# Patient Record
Sex: Female | Born: 1935 | Race: Black or African American | Hispanic: No | Marital: Single
Health system: Southern US, Community
[De-identification: ages and names within clinical notes are randomized; demographics above are authoritative.]

---

## 2004-09-26 ENCOUNTER — Ambulatory Visit: Payer: Self-pay | Admitting: Oncology

## 2004-10-24 ENCOUNTER — Ambulatory Visit: Payer: Self-pay | Admitting: Oncology

## 2004-11-02 ENCOUNTER — Ambulatory Visit: Payer: Self-pay

## 2004-11-24 ENCOUNTER — Ambulatory Visit: Payer: Self-pay | Admitting: Oncology

## 2004-12-24 ENCOUNTER — Ambulatory Visit: Payer: Self-pay | Admitting: Oncology

## 2005-02-26 ENCOUNTER — Ambulatory Visit: Payer: Self-pay | Admitting: General Surgery

## 2005-05-23 ENCOUNTER — Ambulatory Visit: Payer: Self-pay | Admitting: Oncology

## 2005-05-29 ENCOUNTER — Ambulatory Visit: Payer: Self-pay | Admitting: Oncology

## 2005-06-04 ENCOUNTER — Ambulatory Visit: Payer: Self-pay | Admitting: Oncology

## 2005-12-31 ENCOUNTER — Ambulatory Visit: Payer: Self-pay | Admitting: Oncology

## 2006-01-24 ENCOUNTER — Ambulatory Visit: Payer: Self-pay | Admitting: Oncology

## 2006-02-27 ENCOUNTER — Ambulatory Visit: Payer: Self-pay | Admitting: General Surgery

## 2006-07-22 ENCOUNTER — Ambulatory Visit: Payer: Self-pay | Admitting: Oncology

## 2006-07-24 ENCOUNTER — Ambulatory Visit: Payer: Self-pay | Admitting: Oncology

## 2007-01-20 ENCOUNTER — Ambulatory Visit: Payer: Self-pay | Admitting: Oncology

## 2007-01-24 ENCOUNTER — Ambulatory Visit: Payer: Self-pay | Admitting: Oncology

## 2007-03-21 ENCOUNTER — Ambulatory Visit: Payer: Self-pay | Admitting: General Surgery

## 2007-07-25 ENCOUNTER — Ambulatory Visit: Payer: Self-pay | Admitting: Oncology

## 2007-08-08 ENCOUNTER — Ambulatory Visit: Payer: Self-pay | Admitting: Oncology

## 2007-08-25 ENCOUNTER — Ambulatory Visit: Payer: Self-pay | Admitting: Oncology

## 2007-10-14 ENCOUNTER — Emergency Department: Payer: Self-pay | Admitting: Emergency Medicine

## 2007-11-11 ENCOUNTER — Inpatient Hospital Stay: Payer: Self-pay | Admitting: Unknown Physician Specialty

## 2007-11-11 ENCOUNTER — Other Ambulatory Visit: Payer: Self-pay

## 2007-11-19 ENCOUNTER — Ambulatory Visit: Payer: Self-pay | Admitting: *Deleted

## 2007-11-25 ENCOUNTER — Ambulatory Visit: Payer: Self-pay | Admitting: Internal Medicine

## 2007-11-28 ENCOUNTER — Ambulatory Visit: Payer: Self-pay | Admitting: *Deleted

## 2007-12-12 ENCOUNTER — Ambulatory Visit: Payer: Self-pay | Admitting: *Deleted

## 2008-01-17 ENCOUNTER — Ambulatory Visit: Payer: Self-pay | Admitting: Unknown Physician Specialty

## 2008-01-19 ENCOUNTER — Encounter: Payer: Self-pay | Admitting: Family Medicine

## 2008-01-25 ENCOUNTER — Ambulatory Visit: Payer: Self-pay | Admitting: Oncology

## 2008-02-04 ENCOUNTER — Ambulatory Visit: Payer: Self-pay | Admitting: Oncology

## 2008-02-17 ENCOUNTER — Encounter: Payer: Self-pay | Admitting: Family Medicine

## 2008-02-22 ENCOUNTER — Encounter: Payer: Self-pay | Admitting: Family Medicine

## 2008-02-22 ENCOUNTER — Ambulatory Visit: Payer: Self-pay | Admitting: Oncology

## 2008-03-24 ENCOUNTER — Ambulatory Visit: Payer: Self-pay | Admitting: Oncology

## 2008-03-24 ENCOUNTER — Encounter: Payer: Self-pay | Admitting: Family Medicine

## 2008-03-29 ENCOUNTER — Ambulatory Visit: Payer: Self-pay | Admitting: General Surgery

## 2008-04-16 ENCOUNTER — Ambulatory Visit: Payer: Self-pay | Admitting: Oncology

## 2008-04-23 ENCOUNTER — Encounter: Payer: Self-pay | Admitting: Family Medicine

## 2008-04-23 ENCOUNTER — Ambulatory Visit: Payer: Self-pay | Admitting: Oncology

## 2008-05-24 ENCOUNTER — Encounter: Payer: Self-pay | Admitting: Family Medicine

## 2008-06-23 ENCOUNTER — Encounter: Payer: Self-pay | Admitting: Family Medicine

## 2008-07-16 ENCOUNTER — Ambulatory Visit: Payer: Self-pay | Admitting: Oncology

## 2008-07-24 ENCOUNTER — Ambulatory Visit: Payer: Self-pay | Admitting: Oncology

## 2008-07-24 ENCOUNTER — Encounter: Payer: Self-pay | Admitting: Family Medicine

## 2008-08-24 ENCOUNTER — Encounter: Payer: Self-pay | Admitting: Family Medicine

## 2008-12-24 ENCOUNTER — Ambulatory Visit: Payer: Self-pay | Admitting: Oncology

## 2009-01-06 ENCOUNTER — Ambulatory Visit: Payer: Self-pay | Admitting: Oncology

## 2009-01-24 ENCOUNTER — Ambulatory Visit: Payer: Self-pay | Admitting: Oncology

## 2009-03-30 ENCOUNTER — Ambulatory Visit: Payer: Self-pay | Admitting: General Surgery

## 2009-06-23 ENCOUNTER — Ambulatory Visit: Payer: Self-pay | Admitting: Oncology

## 2009-07-07 ENCOUNTER — Ambulatory Visit: Payer: Self-pay | Admitting: Oncology

## 2009-07-24 ENCOUNTER — Ambulatory Visit: Payer: Self-pay | Admitting: Oncology

## 2009-10-07 ENCOUNTER — Ambulatory Visit: Payer: Self-pay | Admitting: Unknown Physician Specialty

## 2009-11-15 ENCOUNTER — Ambulatory Visit: Payer: Self-pay | Admitting: Anesthesiology

## 2009-11-21 ENCOUNTER — Ambulatory Visit: Payer: Self-pay | Admitting: Pain Medicine

## 2009-12-27 ENCOUNTER — Ambulatory Visit: Payer: Self-pay | Admitting: Pain Medicine

## 2010-01-16 ENCOUNTER — Ambulatory Visit: Payer: Self-pay | Admitting: Pain Medicine

## 2010-01-20 ENCOUNTER — Ambulatory Visit: Payer: Self-pay | Admitting: Gastroenterology

## 2010-01-24 ENCOUNTER — Ambulatory Visit: Payer: Self-pay | Admitting: Oncology

## 2010-02-01 ENCOUNTER — Ambulatory Visit: Payer: Self-pay | Admitting: Oncology

## 2010-02-21 ENCOUNTER — Ambulatory Visit: Payer: Self-pay | Admitting: Oncology

## 2010-02-21 ENCOUNTER — Ambulatory Visit: Payer: Self-pay | Admitting: Pain Medicine

## 2010-02-22 ENCOUNTER — Ambulatory Visit: Payer: Self-pay | Admitting: Oncology

## 2010-03-01 ENCOUNTER — Ambulatory Visit: Payer: Self-pay | Admitting: Pain Medicine

## 2010-03-23 ENCOUNTER — Ambulatory Visit: Payer: Self-pay | Admitting: Pain Medicine

## 2010-04-03 ENCOUNTER — Ambulatory Visit: Payer: Self-pay | Admitting: Pain Medicine

## 2010-04-05 ENCOUNTER — Ambulatory Visit: Payer: Self-pay | Admitting: General Surgery

## 2010-05-02 ENCOUNTER — Ambulatory Visit: Payer: Self-pay | Admitting: Pain Medicine

## 2010-06-01 ENCOUNTER — Ambulatory Visit: Payer: Self-pay | Admitting: Pain Medicine

## 2010-06-05 ENCOUNTER — Ambulatory Visit: Payer: Self-pay | Admitting: Pain Medicine

## 2010-07-13 ENCOUNTER — Ambulatory Visit: Payer: Self-pay | Admitting: Pain Medicine

## 2010-08-02 ENCOUNTER — Encounter: Payer: Self-pay | Admitting: Pain Medicine

## 2010-08-24 ENCOUNTER — Ambulatory Visit: Payer: Self-pay | Admitting: Pain Medicine

## 2010-08-30 ENCOUNTER — Encounter: Payer: Self-pay | Admitting: Pain Medicine

## 2010-10-10 ENCOUNTER — Ambulatory Visit: Payer: Self-pay | Admitting: Pain Medicine

## 2010-11-07 ENCOUNTER — Ambulatory Visit: Payer: Self-pay | Admitting: Pain Medicine

## 2011-01-26 ENCOUNTER — Ambulatory Visit: Payer: Self-pay | Admitting: Oncology

## 2011-02-13 ENCOUNTER — Ambulatory Visit: Payer: Self-pay | Admitting: Nephrology

## 2011-02-15 ENCOUNTER — Ambulatory Visit: Payer: Self-pay | Admitting: Oncology

## 2011-02-16 LAB — CANCER ANTIGEN 27.29: CA 27.29: 38.6 U/mL (ref 0.0–38.6)

## 2011-02-22 ENCOUNTER — Ambulatory Visit: Payer: Self-pay | Admitting: Oncology

## 2011-03-25 ENCOUNTER — Ambulatory Visit: Payer: Self-pay | Admitting: Oncology

## 2011-04-02 ENCOUNTER — Observation Stay: Payer: Self-pay | Admitting: Internal Medicine

## 2011-05-01 ENCOUNTER — Ambulatory Visit: Payer: Self-pay | Admitting: Oncology

## 2011-05-25 ENCOUNTER — Ambulatory Visit: Payer: Self-pay | Admitting: Oncology

## 2011-06-24 ENCOUNTER — Ambulatory Visit: Payer: Self-pay | Admitting: Oncology

## 2011-07-20 ENCOUNTER — Ambulatory Visit: Payer: Self-pay | Admitting: Specialist

## 2011-07-31 ENCOUNTER — Ambulatory Visit: Payer: Self-pay | Admitting: Oncology

## 2011-08-25 ENCOUNTER — Ambulatory Visit: Payer: Self-pay | Admitting: Oncology

## 2011-08-28 ENCOUNTER — Ambulatory Visit: Payer: Self-pay | Admitting: Oncology

## 2011-08-29 ENCOUNTER — Ambulatory Visit: Payer: Self-pay | Admitting: Pain Medicine

## 2011-08-30 ENCOUNTER — Ambulatory Visit: Payer: Self-pay | Admitting: Pain Medicine

## 2011-09-17 ENCOUNTER — Ambulatory Visit: Payer: Self-pay | Admitting: Pain Medicine

## 2011-09-18 ENCOUNTER — Other Ambulatory Visit: Payer: Self-pay | Admitting: Pain Medicine

## 2011-09-20 ENCOUNTER — Ambulatory Visit: Payer: Self-pay | Admitting: Pain Medicine

## 2011-09-24 ENCOUNTER — Ambulatory Visit: Payer: Self-pay | Admitting: Oncology

## 2011-10-08 ENCOUNTER — Ambulatory Visit: Payer: Self-pay | Admitting: Pain Medicine

## 2011-10-18 ENCOUNTER — Ambulatory Visit: Payer: Self-pay | Admitting: Pain Medicine

## 2011-10-25 ENCOUNTER — Ambulatory Visit: Payer: Self-pay | Admitting: Oncology

## 2011-10-30 ENCOUNTER — Ambulatory Visit: Payer: Self-pay | Admitting: Oncology

## 2011-11-05 ENCOUNTER — Ambulatory Visit: Payer: Self-pay | Admitting: Pain Medicine

## 2011-11-08 ENCOUNTER — Ambulatory Visit: Payer: Self-pay | Admitting: Pain Medicine

## 2011-11-22 ENCOUNTER — Ambulatory Visit: Payer: Self-pay | Admitting: Pain Medicine

## 2011-11-29 ENCOUNTER — Ambulatory Visit: Payer: Self-pay | Admitting: Pain Medicine

## 2011-12-04 ENCOUNTER — Ambulatory Visit: Payer: Self-pay | Admitting: Oncology

## 2011-12-06 ENCOUNTER — Ambulatory Visit: Payer: Self-pay | Admitting: Pain Medicine

## 2011-12-13 ENCOUNTER — Ambulatory Visit: Payer: Self-pay | Admitting: Pain Medicine

## 2011-12-31 ENCOUNTER — Ambulatory Visit: Payer: Self-pay | Admitting: Pain Medicine

## 2012-01-01 ENCOUNTER — Ambulatory Visit: Payer: Self-pay | Admitting: Oncology

## 2012-01-01 LAB — CBC CANCER CENTER
HCT: 22.8 % — ABNORMAL LOW (ref 35.0–47.0)
HGB: 7.4 g/dL — ABNORMAL LOW (ref 12.0–16.0)
Lymphocytes: 53 %
MCH: 35.1 pg — ABNORMAL HIGH (ref 26.0–34.0)
MCHC: 32.4 g/dL (ref 32.0–36.0)
Platelet: 516 x10 3/mm — ABNORMAL HIGH (ref 150–440)
RDW: 24 % — ABNORMAL HIGH (ref 11.5–14.5)
Segmented Neutrophils: 45 %
WBC: 3.3 x10 3/mm — ABNORMAL LOW (ref 3.6–11.0)

## 2012-01-01 LAB — COMPREHENSIVE METABOLIC PANEL
Anion Gap: 9 (ref 7–16)
Bilirubin,Total: 0.4 mg/dL (ref 0.2–1.0)
Calcium, Total: 9.3 mg/dL (ref 8.5–10.1)
Chloride: 104 mmol/L (ref 98–107)
Co2: 27 mmol/L (ref 21–32)
Creatinine: 1.48 mg/dL — ABNORMAL HIGH (ref 0.60–1.30)
EGFR (African American): 44 — ABNORMAL LOW
EGFR (Non-African Amer.): 36 — ABNORMAL LOW
Glucose: 131 mg/dL — ABNORMAL HIGH (ref 65–99)
Osmolality: 296 (ref 275–301)
Potassium: 4 mmol/L (ref 3.5–5.1)
SGPT (ALT): 16 U/L

## 2012-01-01 LAB — LACTATE DEHYDROGENASE: LDH: 298 U/L — ABNORMAL HIGH (ref 84–246)

## 2012-01-01 LAB — IRON AND TIBC
Iron Bind.Cap.(Total): 235 ug/dL — ABNORMAL LOW (ref 250–450)
Unbound Iron-Bind.Cap.: 135 ug/dL

## 2012-01-01 LAB — RETICULOCYTES
Absolute Retic Count: 0.05 10*6/uL (ref 0.024–0.084)
Reticulocyte: 2.4 % — ABNORMAL HIGH (ref 0.5–1.5)

## 2012-01-02 ENCOUNTER — Ambulatory Visit: Payer: Self-pay | Admitting: Pain Medicine

## 2012-01-17 LAB — CBC CANCER CENTER
Bands: 2 %
Lymphocytes: 64 %
MCH: 33.5 pg (ref 26.0–34.0)
Platelet: 395 x10 3/mm (ref 150–440)
RDW: 22.5 % — ABNORMAL HIGH (ref 11.5–14.5)
Segmented Neutrophils: 19 %
WBC: 2.1 x10 3/mm — ABNORMAL LOW (ref 3.6–11.0)

## 2012-01-24 LAB — CANCER CENTER HEMOGLOBIN: HGB: 8.9 g/dL — ABNORMAL LOW (ref 12.0–16.0)

## 2012-01-25 ENCOUNTER — Ambulatory Visit: Payer: Self-pay | Admitting: Oncology

## 2012-02-07 LAB — CANCER CENTER HEMOGLOBIN: HGB: 8.3 g/dL — ABNORMAL LOW

## 2012-02-22 ENCOUNTER — Ambulatory Visit: Payer: Self-pay | Admitting: Oncology

## 2012-02-29 LAB — COMPREHENSIVE METABOLIC PANEL
Anion Gap: 7 (ref 7–16)
Bilirubin,Total: 0.3 mg/dL (ref 0.2–1.0)
Chloride: 108 mmol/L — ABNORMAL HIGH (ref 98–107)
Co2: 25 mmol/L (ref 21–32)
Creatinine: 1.95 mg/dL — ABNORMAL HIGH (ref 0.60–1.30)
EGFR (Non-African Amer.): 26 — ABNORMAL LOW
Osmolality: 297 (ref 275–301)
SGPT (ALT): 19 U/L
Sodium: 140 mmol/L (ref 136–145)

## 2012-02-29 LAB — CBC CANCER CENTER
Bands: 4 %
Eosinophil: 8 %
HGB: 7.2 g/dL — ABNORMAL LOW (ref 12.0–16.0)
Lymphocytes: 30 %
MCH: 31 pg (ref 26.0–34.0)
Monocytes: 1 %
Platelet: 270 x10 3/mm (ref 150–440)
RBC: 2.32 10*6/uL — ABNORMAL LOW (ref 3.80–5.20)

## 2012-03-03 ENCOUNTER — Inpatient Hospital Stay: Payer: Self-pay | Admitting: Oncology

## 2012-03-03 LAB — COMPREHENSIVE METABOLIC PANEL
Alkaline Phosphatase: 50 U/L (ref 50–136)
Anion Gap: 9 (ref 7–16)
Calcium, Total: 8.8 mg/dL (ref 8.5–10.1)
Co2: 23 mmol/L (ref 21–32)
EGFR (African American): 45 — ABNORMAL LOW
EGFR (Non-African Amer.): 37 — ABNORMAL LOW
Osmolality: 299 (ref 275–301)
Potassium: 4.4 mmol/L (ref 3.5–5.1)
SGOT(AST): 19 U/L (ref 15–37)
Sodium: 143 mmol/L (ref 136–145)
Total Protein: 6.7 g/dL (ref 6.4–8.2)

## 2012-03-03 LAB — CBC WITH DIFFERENTIAL/PLATELET
Eosinophil: 20 %
HCT: 22.4 % — ABNORMAL LOW (ref 35.0–47.0)
HGB: 6.9 g/dL — ABNORMAL LOW (ref 12.0–16.0)
Lymphocytes: 36 %
MCH: 30.5 pg (ref 26.0–34.0)
MCV: 100 fL (ref 80–100)
Monocytes: 0 %
RDW: 23.6 % — ABNORMAL HIGH (ref 11.5–14.5)
Variant Lymphocyte - H1-Rlymph: 1 %
WBC: 8.6 10*3/uL (ref 3.6–11.0)

## 2012-03-03 LAB — PLATELET COUNT: Platelet: 251 10*3/uL (ref 150–440)

## 2012-03-04 LAB — IRON AND TIBC
Iron Bind.Cap.(Total): 179 ug/dL — ABNORMAL LOW (ref 250–450)
Iron Saturation: 51 %
Iron: 91 ug/dL (ref 50–170)
Unbound Iron-Bind.Cap.: 88 ug/dL

## 2012-03-04 LAB — CBC WITH DIFFERENTIAL/PLATELET
Eosinophil: 18 %
HGB: 9.1 g/dL — ABNORMAL LOW (ref 12.0–16.0)
MCH: 31.4 pg (ref 26.0–34.0)
MCV: 98 fL (ref 80–100)
Monocytes: 1 %
Platelet: 158 10*3/uL (ref 150–440)
RBC: 2.91 10*6/uL — ABNORMAL LOW (ref 3.80–5.20)
RDW: 20.5 % — ABNORMAL HIGH (ref 11.5–14.5)

## 2012-03-04 LAB — BASIC METABOLIC PANEL
BUN: 36 mg/dL — ABNORMAL HIGH (ref 7–18)
Calcium, Total: 8.4 mg/dL — ABNORMAL LOW (ref 8.5–10.1)
EGFR (African American): >60
EGFR (Non-African Amer.): 51 — ABNORMAL LOW
Glucose: 84 mg/dL (ref 65–99)
Osmolality: 293 (ref 275–301)
Potassium: 4.2 mmol/L (ref 3.5–5.1)

## 2012-03-04 LAB — FERRITIN: Ferritin (ARMC): 786 ng/mL — ABNORMAL HIGH (ref 8–388)

## 2012-03-05 LAB — CBC WITH DIFFERENTIAL/PLATELET
HCT: 26.5 % — ABNORMAL LOW (ref 35.0–47.0)
MCH: 31.3 pg (ref 26.0–34.0)
MCHC: 31.9 g/dL — ABNORMAL LOW (ref 32.0–36.0)
MCV: 98 fL (ref 80–100)
Platelet: 91 10*3/uL — ABNORMAL LOW (ref 150–440)
RBC: 2.7 10*6/uL — ABNORMAL LOW (ref 3.80–5.20)
RDW: 19.8 % — ABNORMAL HIGH (ref 11.5–14.5)

## 2012-03-05 LAB — URINALYSIS, COMPLETE
Blood: NEGATIVE
Glucose,UR: NEGATIVE mg/dL (ref 0–75)
Hyaline Cast: 1
Ketone: NEGATIVE
Nitrite: NEGATIVE
Ph: 6 (ref 4.5–8.0)
Squamous Epithelial: 2
WBC UR: 27 /HPF (ref 0–5)

## 2012-03-05 LAB — PROTEIN / CREATININE RATIO, URINE
Protein, Random Urine: 23 mg/dL — ABNORMAL HIGH (ref 0–12)
Protein/Creat. Ratio: 447 mg/gCREAT — ABNORMAL HIGH (ref 0–200)

## 2012-03-06 LAB — CBC WITH DIFFERENTIAL/PLATELET
Eosinophil: 31 %
HCT: 30.3 % — ABNORMAL LOW (ref 35.0–47.0)
HGB: 9.7 g/dL — ABNORMAL LOW (ref 12.0–16.0)
Lymphocytes: 29 %
MCH: 30.8 pg (ref 26.0–34.0)
MCHC: 31.9 g/dL — ABNORMAL LOW (ref 32.0–36.0)
WBC: 5 10*3/uL (ref 3.6–11.0)

## 2012-03-07 ENCOUNTER — Ambulatory Visit: Payer: Self-pay | Admitting: Oncology

## 2012-03-08 LAB — CULTURE, BLOOD (SINGLE)

## 2012-03-11 LAB — COMPREHENSIVE METABOLIC PANEL
Albumin: 2.6 g/dL — ABNORMAL LOW (ref 3.4–5.0)
Alkaline Phosphatase: 73 U/L (ref 50–136)
Anion Gap: 9 (ref 7–16)
BUN: 24 mg/dL — ABNORMAL HIGH (ref 7–18)
Calcium, Total: 8.7 mg/dL (ref 8.5–10.1)
Co2: 25 mmol/L (ref 21–32)
EGFR (Non-African Amer.): 48 — ABNORMAL LOW
Glucose: 102 mg/dL — ABNORMAL HIGH (ref 65–99)
Potassium: 3.8 mmol/L (ref 3.5–5.1)
SGOT(AST): 18 U/L (ref 15–37)
Sodium: 142 mmol/L (ref 136–145)

## 2012-03-11 LAB — CBC CANCER CENTER
Eosinophil: 23 %
HCT: 31 % — ABNORMAL LOW (ref 35.0–47.0)
Lymphocytes: 33 %
MCH: 30.2 pg (ref 26.0–34.0)
MCHC: 32.3 g/dL (ref 32.0–36.0)
Monocytes: 4 %
NRBC/100 WBC: 1 /100
RDW: 18.5 % — ABNORMAL HIGH (ref 11.5–14.5)
WBC: 5.6 x10 3/mm (ref 3.6–11.0)

## 2012-03-20 ENCOUNTER — Ambulatory Visit: Payer: Self-pay | Admitting: Oncology

## 2012-03-24 ENCOUNTER — Ambulatory Visit: Payer: Self-pay | Admitting: Oncology

## 2012-03-24 LAB — COMPREHENSIVE METABOLIC PANEL
Albumin: 3 g/dL — ABNORMAL LOW (ref 3.4–5.0)
Alkaline Phosphatase: 72 U/L (ref 50–136)
Anion Gap: 8 (ref 7–16)
BUN: 37 mg/dL — ABNORMAL HIGH (ref 7–18)
Bilirubin,Total: 0.5 mg/dL (ref 0.2–1.0)
Calcium, Total: 9.2 mg/dL (ref 8.5–10.1)
Creatinine: 1.44 mg/dL — ABNORMAL HIGH (ref 0.60–1.30)
EGFR (African American): 45 — ABNORMAL LOW
EGFR (Non-African Amer.): 37 — ABNORMAL LOW
Osmolality: 298 (ref 275–301)
SGOT(AST): 16 U/L (ref 15–37)
SGPT (ALT): 22 U/L
Sodium: 143 mmol/L (ref 136–145)

## 2012-03-24 LAB — CBC CANCER CENTER
Eosinophil: 2 %
Lymphocytes: 43 %
MCHC: 32.8 g/dL (ref 32.0–36.0)
MCV: 92 fL (ref 80–100)
Platelet: 201 x10 3/mm (ref 150–440)
RDW: 18.8 % — ABNORMAL HIGH (ref 11.5–14.5)
Segmented Neutrophils: 54 %

## 2012-04-23 ENCOUNTER — Ambulatory Visit: Payer: Self-pay | Admitting: Oncology

## 2012-05-21 LAB — COMPREHENSIVE METABOLIC PANEL
Albumin: 3.3 g/dL — ABNORMAL LOW (ref 3.4–5.0)
Alkaline Phosphatase: 78 U/L (ref 50–136)
Anion Gap: 11 (ref 7–16)
BUN: 51 mg/dL — ABNORMAL HIGH (ref 7–18)
Bilirubin,Total: 0.5 mg/dL (ref 0.2–1.0)
Calcium, Total: 9.4 mg/dL (ref 8.5–10.1)
Chloride: 99 mmol/L (ref 98–107)
Co2: 28 mmol/L (ref 21–32)
EGFR (African American): 45 — ABNORMAL LOW
EGFR (Non-African Amer.): 39 — ABNORMAL LOW
Glucose: 254 mg/dL — ABNORMAL HIGH (ref 65–99)
Osmolality: 298 (ref 275–301)
Potassium: 3.5 mmol/L (ref 3.5–5.1)
SGPT (ALT): 22 U/L
Total Protein: 8 g/dL (ref 6.4–8.2)

## 2012-05-21 LAB — CBC CANCER CENTER
HCT: 24.1 % — ABNORMAL LOW (ref 35.0–47.0)
HGB: 7 g/dL — ABNORMAL LOW (ref 12.0–16.0)
MCH: 28.7 pg (ref 26.0–34.0)
MCHC: 29.2 g/dL — ABNORMAL LOW (ref 32.0–36.0)
MCV: 98 fL (ref 80–100)
Monocytes: 2 %
NRBC/100 WBC: 1 /100
RDW: 24.3 % — ABNORMAL HIGH (ref 11.5–14.5)
Segmented Neutrophils: 69 %

## 2012-05-24 ENCOUNTER — Ambulatory Visit: Payer: Self-pay | Admitting: Oncology

## 2012-06-23 ENCOUNTER — Ambulatory Visit: Payer: Self-pay | Admitting: Oncology

## 2012-07-10 ENCOUNTER — Ambulatory Visit: Payer: Self-pay | Admitting: Oncology

## 2012-07-10 LAB — CBC CANCER CENTER
Bands: 1 %
HGB: 5.5 g/dL — ABNORMAL LOW (ref 12.0–16.0)
Lymphocytes: 54 %
MCH: 28.2 pg (ref 26.0–34.0)
MCV: 99 fL (ref 80–100)
Monocytes: 2 %
NRBC/100 WBC: 2 /100
Platelet: 325 x10 3/mm (ref 150–440)
RBC: 1.95 10*6/uL — ABNORMAL LOW (ref 3.80–5.20)
RDW: 27.2 % — ABNORMAL HIGH (ref 11.5–14.5)
Segmented Neutrophils: 35 %
Variant Lymphocyte: 6 %
WBC: 3.7 x10 3/mm (ref 3.6–11.0)

## 2012-07-10 LAB — COMPREHENSIVE METABOLIC PANEL
Anion Gap: 11 (ref 7–16)
BUN: 43 mg/dL — ABNORMAL HIGH (ref 7–18)
Bilirubin,Total: 0.6 mg/dL (ref 0.2–1.0)
Chloride: 106 mmol/L (ref 98–107)
Creatinine: 1.3 mg/dL (ref 0.60–1.30)
EGFR (African American): 45 — ABNORMAL LOW
EGFR (Non-African Amer.): 39 — ABNORMAL LOW
Glucose: 107 mg/dL — ABNORMAL HIGH (ref 65–99)
Osmolality: 293 (ref 275–301)
Potassium: 3.6 mmol/L (ref 3.5–5.1)
SGPT (ALT): 15 U/L
Sodium: 141 mmol/L (ref 136–145)
Total Protein: 7.3 g/dL (ref 6.4–8.2)

## 2012-07-14 ENCOUNTER — Inpatient Hospital Stay: Payer: Self-pay | Admitting: Oncology

## 2012-07-14 LAB — URINALYSIS, COMPLETE
Bilirubin,UR: NEGATIVE
Glucose,UR: NEGATIVE mg/dL (ref 0–75)
Hyaline Cast: 1
Ketone: NEGATIVE
Nitrite: NEGATIVE
Ph: 5 (ref 4.5–8.0)
RBC,UR: 1 /HPF (ref 0–5)
Squamous Epithelial: <1
Transitional Epi: <1
WBC UR: 10 /HPF (ref 0–5)

## 2012-07-14 LAB — COMPREHENSIVE METABOLIC PANEL
Anion Gap: 12 (ref 7–16)
BUN: 35 mg/dL — ABNORMAL HIGH (ref 7–18)
Chloride: 106 mmol/L (ref 98–107)
Co2: 24 mmol/L (ref 21–32)
Creatinine: 1.17 mg/dL (ref 0.60–1.30)
EGFR (African American): 51 — ABNORMAL LOW
Glucose: 175 mg/dL — ABNORMAL HIGH (ref 65–99)
Osmolality: 295 (ref 275–301)
Potassium: 3.7 mmol/L (ref 3.5–5.1)
Sodium: 142 mmol/L (ref 136–145)

## 2012-07-14 LAB — IRON AND TIBC
Iron Saturation: 61 %
Unbound Iron-Bind.Cap.: 103 ug/dL

## 2012-07-14 LAB — CBC CANCER CENTER
HCT: 25.4 % — ABNORMAL LOW (ref 35.0–47.0)
Lymphocytes: 62 %
MCH: 27.9 pg (ref 26.0–34.0)
MCHC: 29.3 g/dL — ABNORMAL LOW (ref 32.0–36.0)
MCV: 95 fL (ref 80–100)
Monocytes: 4 %
Platelet: 270 x10 3/mm (ref 150–440)
RBC: 2.66 10*6/uL — ABNORMAL LOW (ref 3.80–5.20)
Segmented Neutrophils: 26 %
Variant Lymphocyte: 5 %

## 2012-07-14 LAB — RETICULOCYTES
Absolute Retic Count: 0.0675 10*6/uL (ref 0.023–0.096)
Reticulocyte: 2.54 % — ABNORMAL HIGH (ref 0.5–2.2)

## 2012-07-14 LAB — FERRITIN: Ferritin (ARMC): 747 ng/mL — ABNORMAL HIGH (ref 8–388)

## 2012-07-15 LAB — CBC WITH DIFFERENTIAL/PLATELET
HGB: 9.6 g/dL — ABNORMAL LOW (ref 12.0–16.0)
Lymphocytes: 69 %
MCHC: 31.5 g/dL — ABNORMAL LOW (ref 32.0–36.0)
Monocytes: 3 %
RBC: 3.28 10*6/uL — ABNORMAL LOW (ref 3.80–5.20)
RDW: 20.8 % — ABNORMAL HIGH (ref 11.5–14.5)
Segmented Neutrophils: 27 %

## 2012-07-15 LAB — COMPREHENSIVE METABOLIC PANEL
Albumin: 2.5 g/dL — ABNORMAL LOW (ref 3.4–5.0)
Alkaline Phosphatase: 79 U/L (ref 50–136)
Anion Gap: 9 (ref 7–16)
BUN: 32 mg/dL — ABNORMAL HIGH (ref 7–18)
Calcium, Total: 8.4 mg/dL — ABNORMAL LOW (ref 8.5–10.1)
EGFR (Non-African Amer.): 56 — ABNORMAL LOW
Glucose: 77 mg/dL (ref 65–99)
SGOT(AST): 14 U/L — ABNORMAL LOW (ref 15–37)

## 2012-07-16 LAB — BASIC METABOLIC PANEL
Calcium, Total: 9 mg/dL (ref 8.5–10.1)
Co2: 24 mmol/L (ref 21–32)
Creatinine: 0.75 mg/dL (ref 0.60–1.30)
EGFR (African American): >60
Osmolality: 279 (ref 275–301)
Potassium: 3.8 mmol/L (ref 3.5–5.1)
Sodium: 138 mmol/L (ref 136–145)

## 2012-07-16 LAB — CBC WITH DIFFERENTIAL/PLATELET
Bands: 2 %
HCT: 32.4 % — ABNORMAL LOW (ref 35.0–47.0)
HGB: 10.2 g/dL — ABNORMAL LOW (ref 12.0–16.0)
Lymphocytes: 49 %
MCH: 29.3 pg (ref 26.0–34.0)
MCHC: 31.6 g/dL — ABNORMAL LOW (ref 32.0–36.0)
Monocytes: 2 %
RBC: 3.5 10*6/uL — ABNORMAL LOW (ref 3.80–5.20)
Segmented Neutrophils: 41 %
Variant Lymphocyte - H1-Rlymph: 6 %
WBC: 2.9 10*3/uL — ABNORMAL LOW (ref 3.6–11.0)

## 2012-07-16 LAB — URINE CULTURE

## 2012-07-17 LAB — CBC WITH DIFFERENTIAL/PLATELET
Basophil %: 0.5 %
Eosinophil %: 0.2 %
HGB: 9.9 g/dL — ABNORMAL LOW (ref 12.0–16.0)
MCH: 29.1 pg (ref 26.0–34.0)
MCHC: 31.7 g/dL — ABNORMAL LOW (ref 32.0–36.0)
MCV: 92 fL (ref 80–100)
Monocyte #: 0 x10 3/mm — ABNORMAL LOW (ref 0.2–0.9)
Monocyte %: 1.8 %
Neutrophil #: 0.6 10*3/uL — ABNORMAL LOW (ref 1.4–6.5)
Neutrophil %: 25.4 %
Platelet: 175 10*3/uL (ref 150–440)
RBC: 3.4 10*6/uL — ABNORMAL LOW (ref 3.80–5.20)
WBC: 2.2 10*3/uL — ABNORMAL LOW (ref 3.6–11.0)

## 2012-07-24 ENCOUNTER — Ambulatory Visit: Payer: Self-pay | Admitting: Oncology

## 2012-07-24 IMAGING — US US RENAL KIDNEY
1 series · 17 of 25 positions shown · non-contrast
Comparison: none

REASON FOR EXAM: acute renal failure
COMMENTS:

[Series 1: us renal kidney · 17 of 65 slices shown]
[im 1/65]
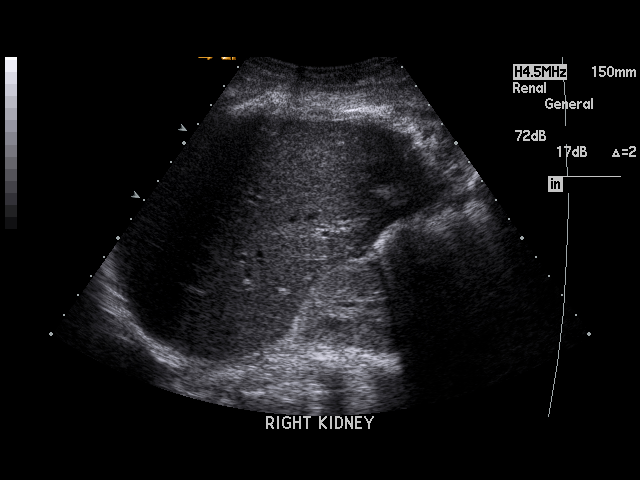
[im 6/65]
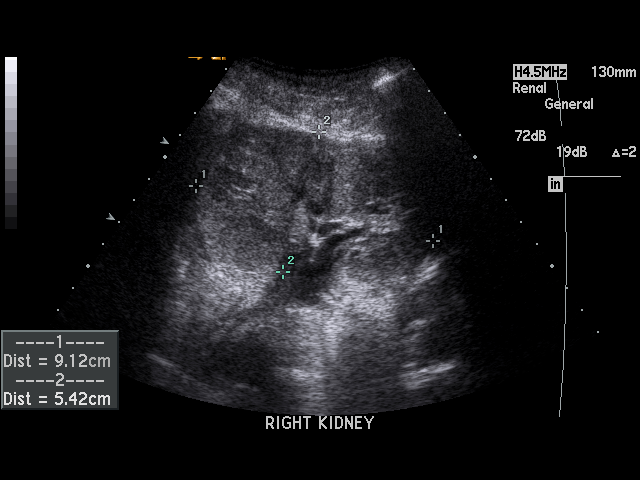
[im 9/65]
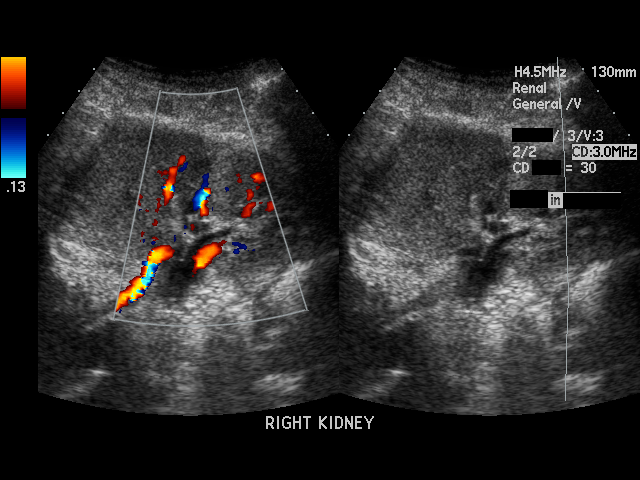
[im 14/65]
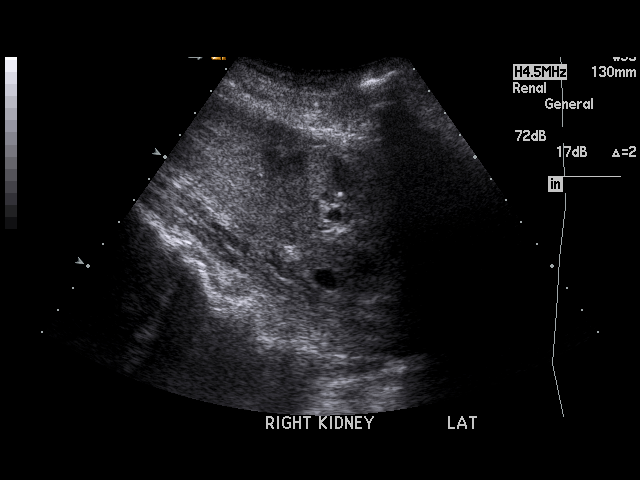
[im 17/65]
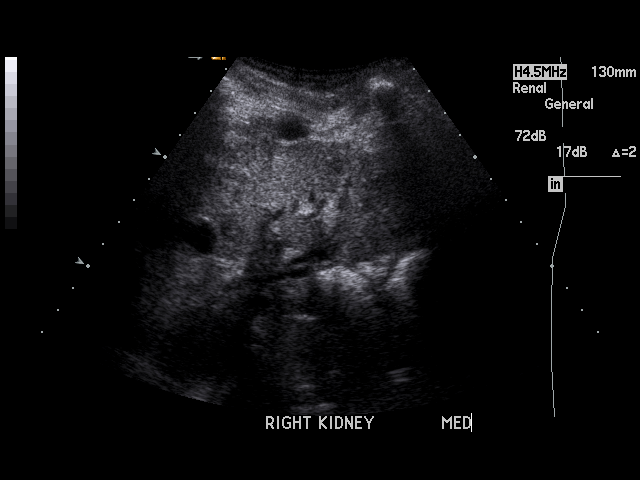
[im 22/65]
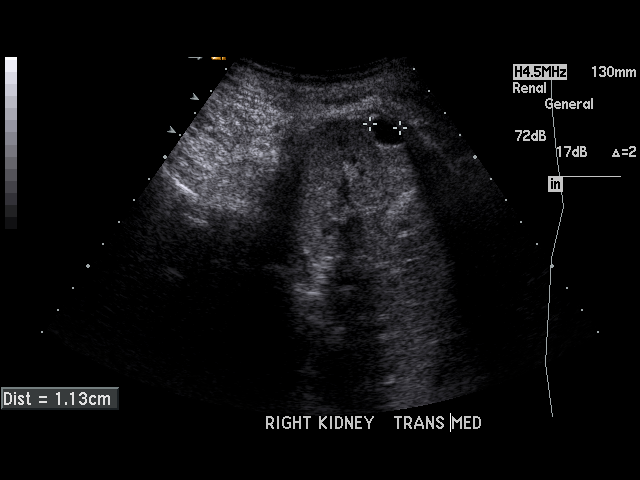
[im 25/65]
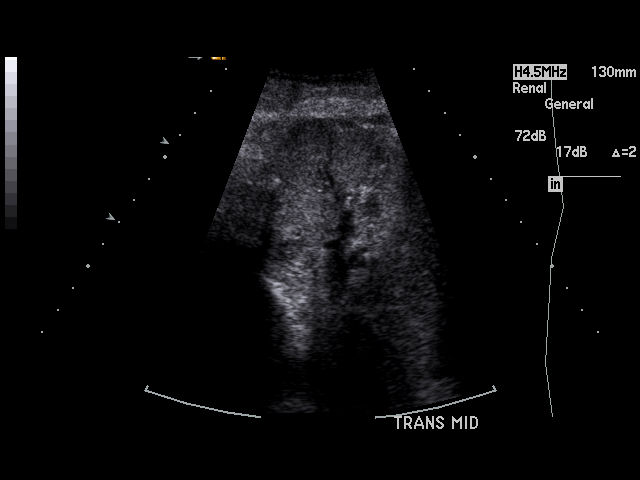
[im 30/65]
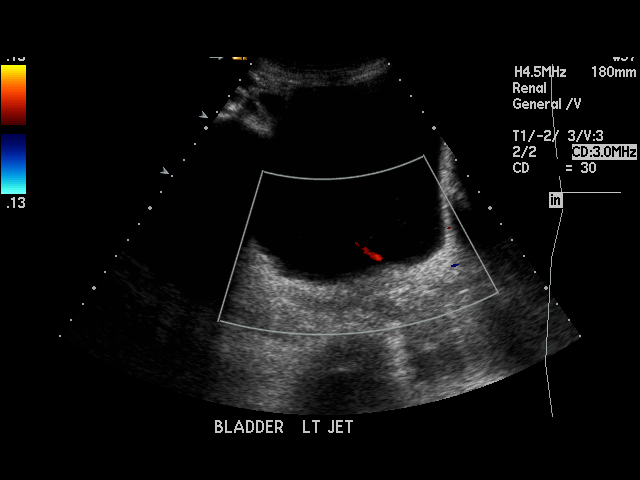
[im 33/65]
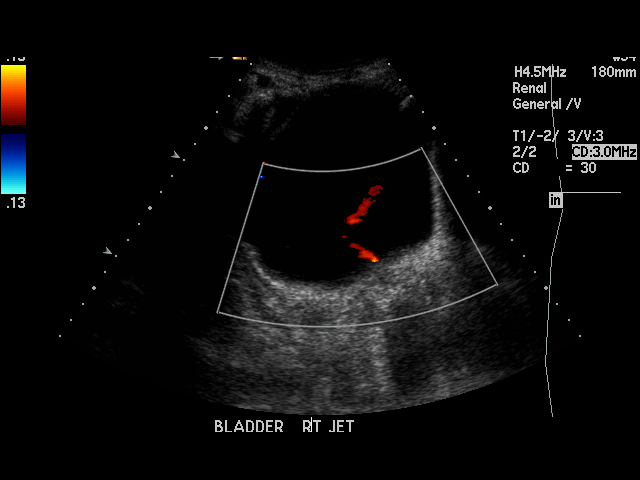
[im 35/65]
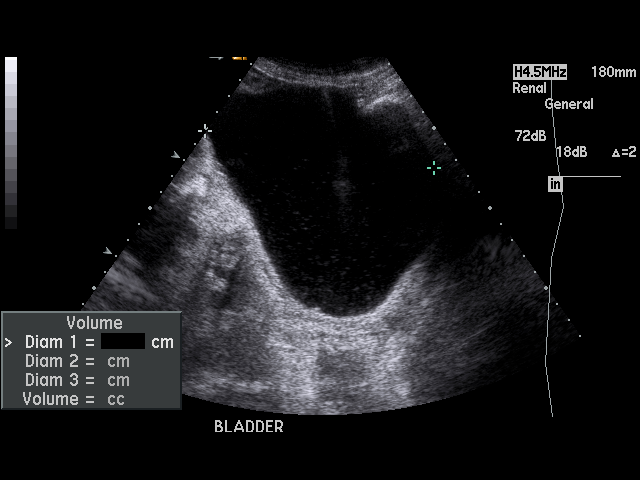
[im 41/65]
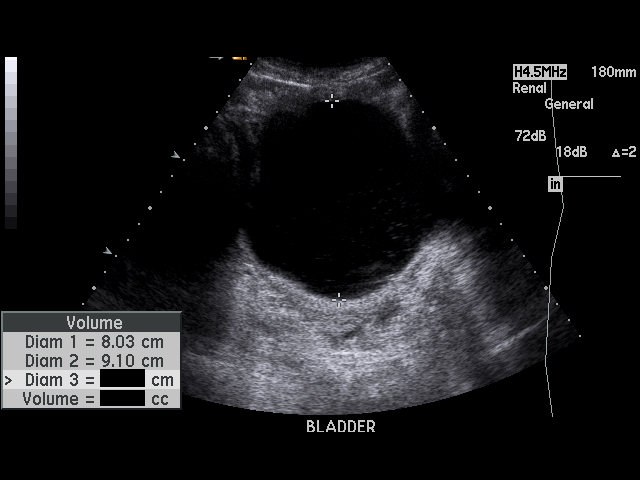
[im 43/65]
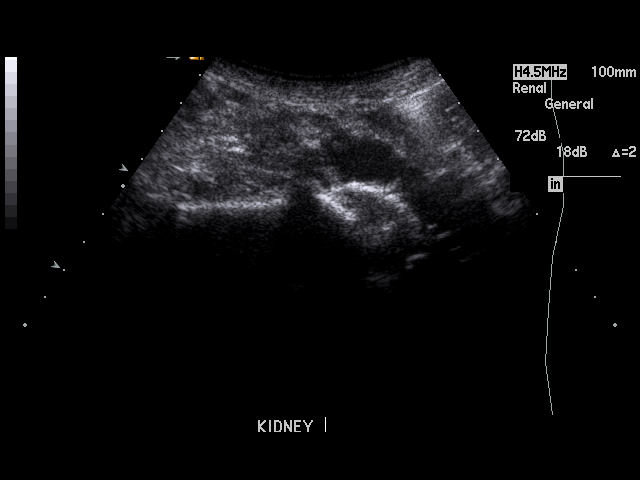
[im 49/65]
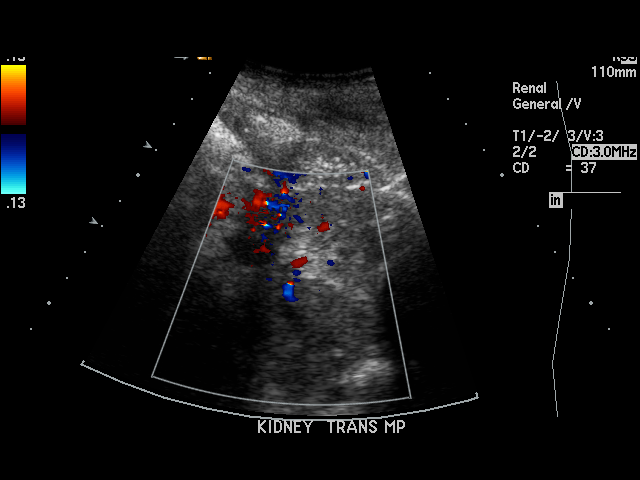
[im 51/65]
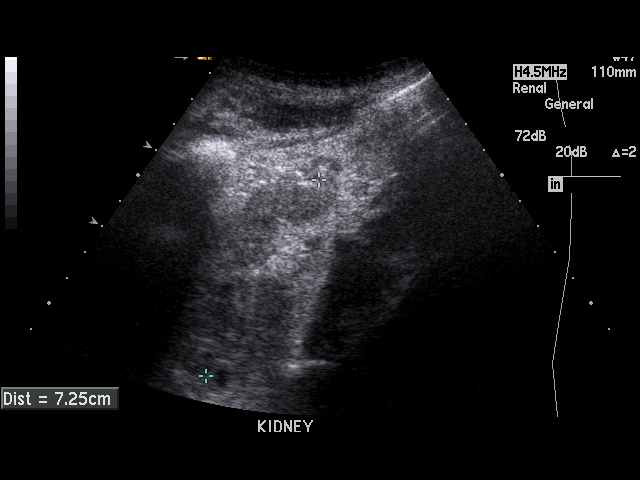
[im 57/65]
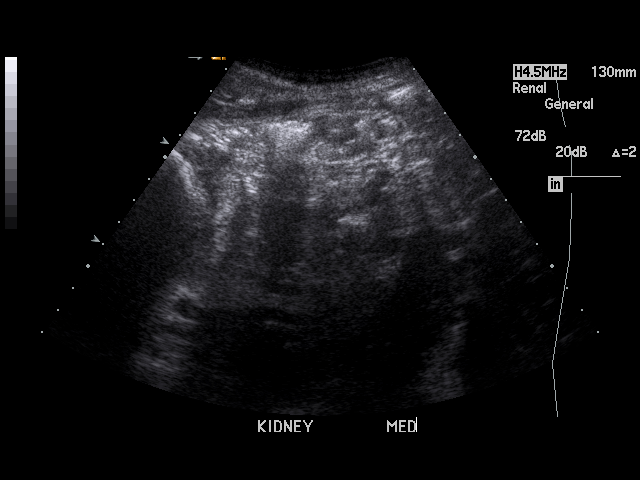
[im 59/65]
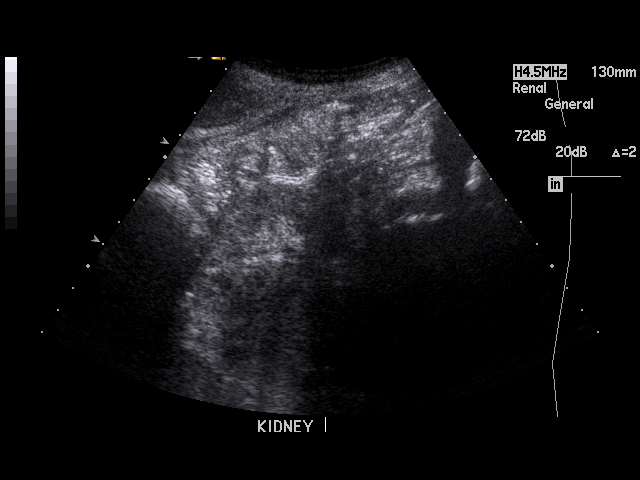
[im 65/65]
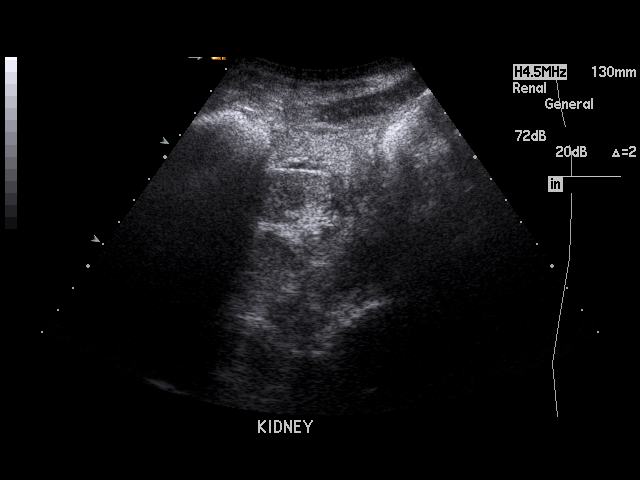

[17 of 25 positions shown; findings below may reference images not displayed]

PROCEDURE:     US  - US KIDNEY  - March 04, 2012 [DATE]

RESULT:     The right kidney measures 9.3 cm x 6.1 cm x 6.09 cm and the left
kidney measures 7.25 cm x 4.3 cm x 4.6 cm. There are noted approximately
three cysts in the right kidney with the two largest each measuring 1.32 cm
at maximum diameter. No solid renal mass lesions are seen. No renal
calcifications are identified. The kidneys bilaterally are hyperechogenic
consistent with the clinical history of diminished renal function. No
hydronephrosis is seen on either side. The visualized portion of the urinary
bladder shows no significant abnormalities. Bilateral ureteral flow jets are
seen. Prevoid urinary bladder volume measured 742 mL and the postvoid
urinary bladder volume measured 390 mL.
IMPRESSION: 1. The kidneys are bilaterally hyperechogenic consistent with a history of
diminished renal function.
2. No hydronephrosis is seen on either side.
3. A few right renal cysts are observed.
4. Bilateral ureteral flow jets are seen.
5. Postvoid urinary bladder volume measures 390 mL and prevoid bladder
volume measures 742 mL.

## 2012-08-08 LAB — CBC CANCER CENTER
Basophil: 1 %
Eosinophil: 2 %
HCT: 22 % — ABNORMAL LOW (ref 35.0–47.0)
HGB: 6.7 g/dL — ABNORMAL LOW (ref 12.0–16.0)
MCH: 27.3 pg (ref 26.0–34.0)
MCHC: 30.2 g/dL — ABNORMAL LOW (ref 32.0–36.0)
MCV: 91 fL (ref 80–100)
Monocytes: 3 %
Platelet: 419 x10 3/mm (ref 150–440)
RBC: 2.43 10*6/uL — ABNORMAL LOW (ref 3.80–5.20)
Variant Lymphocyte: 2 %
WBC: 2.8 x10 3/mm — ABNORMAL LOW (ref 3.6–11.0)

## 2012-08-24 ENCOUNTER — Ambulatory Visit: Payer: Self-pay | Admitting: Oncology

## 2012-09-09 ENCOUNTER — Observation Stay: Payer: Self-pay | Admitting: Oncology

## 2012-09-09 LAB — COMPREHENSIVE METABOLIC PANEL
Albumin: 3.1 g/dL — ABNORMAL LOW (ref 3.4–5.0)
Alkaline Phosphatase: 63 U/L (ref 50–136)
Anion Gap: 4 — ABNORMAL LOW (ref 7–16)
BUN: 22 mg/dL — ABNORMAL HIGH (ref 7–18)
Calcium, Total: 8.9 mg/dL (ref 8.5–10.1)
Co2: 30 mmol/L (ref 21–32)
Creatinine: 0.96 mg/dL (ref 0.60–1.30)
EGFR (Non-African Amer.): 56 — ABNORMAL LOW
Glucose: 103 mg/dL — ABNORMAL HIGH (ref 65–99)
Osmolality: 287 (ref 275–301)
SGOT(AST): 18 U/L (ref 15–37)
SGPT (ALT): 15 U/L (ref 12–78)
Sodium: 142 mmol/L (ref 136–145)
Total Protein: 7.4 g/dL (ref 6.4–8.2)

## 2012-09-09 LAB — CBC CANCER CENTER
HCT: 21.6 % — ABNORMAL LOW (ref 35.0–47.0)
HGB: 6.6 g/dL — ABNORMAL LOW (ref 12.0–16.0)
MCH: 26.6 pg (ref 26.0–34.0)
MCHC: 30.6 g/dL — ABNORMAL LOW (ref 32.0–36.0)
Monocytes: 3 %
Platelet: 329 x10 3/mm (ref 150–440)
RDW: 21.8 % — ABNORMAL HIGH (ref 11.5–14.5)

## 2012-09-23 ENCOUNTER — Ambulatory Visit: Payer: Self-pay | Admitting: Oncology

## 2012-10-05 ENCOUNTER — Inpatient Hospital Stay: Payer: Self-pay | Admitting: Internal Medicine

## 2012-10-05 LAB — COMPREHENSIVE METABOLIC PANEL
Albumin: 1.9 g/dL — ABNORMAL LOW (ref 3.4–5.0)
Alkaline Phosphatase: 49 U/L — ABNORMAL LOW (ref 50–136)
Anion Gap: 17 — ABNORMAL HIGH (ref 7–16)
BUN: 56 mg/dL — ABNORMAL HIGH (ref 7–18)
Calcium, Total: 8.3 mg/dL — ABNORMAL LOW (ref 8.5–10.1)
Creatinine: 3.46 mg/dL — ABNORMAL HIGH (ref 0.60–1.30)
EGFR (Non-African Amer.): 12 — ABNORMAL LOW
Glucose: 323 mg/dL — ABNORMAL HIGH (ref 65–99)
Osmolality: 305 (ref 275–301)
SGOT(AST): 22 U/L (ref 15–37)
SGPT (ALT): 13 U/L (ref 12–78)
Total Protein: 5.5 g/dL — ABNORMAL LOW (ref 6.4–8.2)

## 2012-10-05 LAB — CBC
HCT: 30.6 % — ABNORMAL LOW (ref 35.0–47.0)
HGB: 9.5 g/dL — ABNORMAL LOW (ref 12.0–16.0)
MCH: 26.6 pg (ref 26.0–34.0)
MCHC: 31 g/dL — ABNORMAL LOW (ref 32.0–36.0)
RDW: 20.2 % — ABNORMAL HIGH (ref 11.5–14.5)

## 2012-10-05 LAB — URINALYSIS, COMPLETE
Bilirubin,UR: NEGATIVE
Blood: NEGATIVE
Glucose,UR: NEGATIVE mg/dL (ref 0–75)
Ketone: NEGATIVE
Leukocyte Esterase: NEGATIVE
Ph: 5 (ref 4.5–8.0)
Specific Gravity: 1.02 (ref 1.003–1.030)
Squamous Epithelial: 1

## 2012-10-06 LAB — BASIC METABOLIC PANEL
Anion Gap: 17 — ABNORMAL HIGH (ref 7–16)
BUN: 61 mg/dL — ABNORMAL HIGH (ref 7–18)
Calcium, Total: 8 mg/dL — ABNORMAL LOW (ref 8.5–10.1)
Co2: 13 mmol/L — ABNORMAL LOW (ref 21–32)
Creatinine: 3.48 mg/dL — ABNORMAL HIGH (ref 0.60–1.30)
EGFR (African American): 14 — ABNORMAL LOW
EGFR (Non-African Amer.): 12 — ABNORMAL LOW
Osmolality: 302 (ref 275–301)
Potassium: 4.8 mmol/L (ref 3.5–5.1)

## 2012-10-06 LAB — URINALYSIS, COMPLETE
Blood: NEGATIVE
Glucose,UR: NEGATIVE mg/dL (ref 0–75)
Hyaline Cast: 27
Ketone: NEGATIVE
Nitrite: NEGATIVE
Protein: NEGATIVE
Specific Gravity: 1.021 (ref 1.003–1.030)

## 2012-10-06 LAB — CBC WITH DIFFERENTIAL/PLATELET
Bands: 6 %
HCT: 28.4 % — ABNORMAL LOW (ref 35.0–47.0)
HGB: 9.1 g/dL — ABNORMAL LOW (ref 12.0–16.0)
Lymphocytes: 19 %
Metamyelocyte: 1 %
Monocytes: 1 %
NRBC/100 WBC: 11 /
Platelet: 275 10*3/uL (ref 150–440)
WBC: 31.2 10*3/uL — ABNORMAL HIGH (ref 3.6–11.0)

## 2012-10-07 LAB — CBC WITH DIFFERENTIAL/PLATELET
HCT: 24.5 % — ABNORMAL LOW (ref 35.0–47.0)
HGB: 7.5 g/dL — ABNORMAL LOW (ref 12.0–16.0)
MCH: 26.2 pg (ref 26.0–34.0)
MCHC: 30.5 g/dL — ABNORMAL LOW (ref 32.0–36.0)
MCV: 86 fL (ref 80–100)
Metamyelocyte: 2 %
Myelocyte: 2 %
NRBC/100 WBC: 15 /
RBC: 2.86 10*6/uL — ABNORMAL LOW (ref 3.80–5.20)
RDW: 19.6 % — ABNORMAL HIGH (ref 11.5–14.5)

## 2012-10-07 LAB — URINE CULTURE

## 2012-10-07 LAB — BASIC METABOLIC PANEL
Anion Gap: 16 (ref 7–16)
Calcium, Total: 7.4 mg/dL — ABNORMAL LOW (ref 8.5–10.1)
Co2: 14 mmol/L — ABNORMAL LOW (ref 21–32)
Creatinine: 3.93 mg/dL — ABNORMAL HIGH (ref 0.60–1.30)
EGFR (Non-African Amer.): 10 — ABNORMAL LOW
Glucose: 232 mg/dL — ABNORMAL HIGH (ref 65–99)
Osmolality: 304 (ref 275–301)

## 2012-10-08 LAB — CBC WITH DIFFERENTIAL/PLATELET
Bands: 4 %
HCT: 22.4 % — ABNORMAL LOW (ref 35.0–47.0)
HGB: 7 g/dL — ABNORMAL LOW (ref 12.0–16.0)
Lymphocytes: 7 %
MCHC: 31.4 g/dL — ABNORMAL LOW (ref 32.0–36.0)
MCV: 84 fL (ref 80–100)
Myelocyte: 1 %
NRBC/100 WBC: 25 /
RDW: 19.6 % — ABNORMAL HIGH (ref 11.5–14.5)
Segmented Neutrophils: 81 %
WBC: 28.1 10*3/uL — ABNORMAL HIGH (ref 3.6–11.0)

## 2012-10-08 LAB — BASIC METABOLIC PANEL
Anion Gap: 15 (ref 7–16)
BUN: 72 mg/dL — ABNORMAL HIGH (ref 7–18)
Calcium, Total: 7.6 mg/dL — ABNORMAL LOW (ref 8.5–10.1)
Chloride: 103 mmol/L (ref 98–107)
Co2: 21 mmol/L (ref 21–32)
Creatinine: 3.81 mg/dL — ABNORMAL HIGH (ref 0.60–1.30)
Osmolality: 302 (ref 275–301)

## 2012-10-09 LAB — BASIC METABOLIC PANEL
BUN: 64 mg/dL — ABNORMAL HIGH (ref 7–18)
Calcium, Total: 7.7 mg/dL — ABNORMAL LOW (ref 8.5–10.1)
Creatinine: 2.91 mg/dL — ABNORMAL HIGH (ref 0.60–1.30)
EGFR (Non-African Amer.): 15 — ABNORMAL LOW
Glucose: 157 mg/dL — ABNORMAL HIGH (ref 65–99)
Osmolality: 297 (ref 275–301)
Potassium: 3.4 mmol/L — ABNORMAL LOW (ref 3.5–5.1)
Sodium: 138 mmol/L (ref 136–145)

## 2012-10-09 LAB — CBC WITH DIFFERENTIAL/PLATELET
Bands: 3 %
HCT: 23.4 % — ABNORMAL LOW (ref 35.0–47.0)
HGB: 7.5 g/dL — ABNORMAL LOW (ref 12.0–16.0)
MCHC: 32.1 g/dL (ref 32.0–36.0)
MCV: 84 fL (ref 80–100)
Monocytes: 1 %
Platelet: 134 10*3/uL — ABNORMAL LOW (ref 150–440)
RBC: 2.78 10*6/uL — ABNORMAL LOW (ref 3.80–5.20)
WBC: 14.9 10*3/uL — ABNORMAL HIGH (ref 3.6–11.0)

## 2012-10-09 LAB — MAGNESIUM: Magnesium: 2.1 mg/dL

## 2012-10-10 LAB — BASIC METABOLIC PANEL
Anion Gap: 8 (ref 7–16)
BUN: 67 mg/dL — ABNORMAL HIGH (ref 7–18)
Calcium, Total: 7.5 mg/dL — ABNORMAL LOW (ref 8.5–10.1)
Chloride: 102 mmol/L (ref 98–107)
Co2: 31 mmol/L (ref 21–32)
Creatinine: 2.89 mg/dL — ABNORMAL HIGH (ref 0.60–1.30)
EGFR (Non-African Amer.): 15 — ABNORMAL LOW
Glucose: 58 mg/dL — ABNORMAL LOW (ref 65–99)
Osmolality: 298 (ref 275–301)

## 2012-10-10 LAB — CBC WITH DIFFERENTIAL/PLATELET
HCT: 23 % — ABNORMAL LOW (ref 35.0–47.0)
HGB: 7.3 g/dL — ABNORMAL LOW (ref 12.0–16.0)
MCH: 27.2 pg (ref 26.0–34.0)
MCHC: 31.6 g/dL — ABNORMAL LOW (ref 32.0–36.0)
MCV: 86 fL (ref 80–100)
Metamyelocyte: 2 %
Monocytes: 1 %
Myelocyte: 1 %
NRBC/100 WBC: 40 /
RBC: 2.67 10*6/uL — ABNORMAL LOW (ref 3.80–5.20)
RDW: 19.2 % — ABNORMAL HIGH (ref 11.5–14.5)
Variant Lymphocyte - H1-Rlymph: 1 %

## 2012-10-10 LAB — AMMONIA: Ammonia, Plasma: <25 mcmol/L (ref 11–32)

## 2012-10-11 LAB — CBC WITH DIFFERENTIAL/PLATELET
Bands: 2 %
HCT: 22.1 % — ABNORMAL LOW
HGB: 6.5 g/dL — ABNORMAL LOW
Lymphocytes: 15 %
MCH: 25.4 pg — ABNORMAL LOW
MCHC: 29.3 g/dL — ABNORMAL LOW
MCV: 87 fL
Metamyelocyte: 4 %
NRBC/100 WBC: 14 /
Platelet: 111 x10 3/mm 3 — ABNORMAL LOW
RBC: 2.54 X10 6/mm 3 — ABNORMAL LOW
RDW: 19.1 % — ABNORMAL HIGH
Segmented Neutrophils: 79 %
WBC: 8.3 x10 3/mm 3

## 2012-10-11 LAB — BASIC METABOLIC PANEL WITH GFR
Anion Gap: 11
BUN: 63 mg/dL — ABNORMAL HIGH
Calcium, Total: 7.7 mg/dL — ABNORMAL LOW
Chloride: 101 mmol/L
Co2: 30 mmol/L
Creatinine: 2.64 mg/dL — ABNORMAL HIGH
EGFR (African American): 19 — ABNORMAL LOW
EGFR (Non-African Amer.): 16 — ABNORMAL LOW
Glucose: 106 mg/dL — ABNORMAL HIGH
Osmolality: 302
Potassium: 3.4 mmol/L — ABNORMAL LOW
Sodium: 142 mmol/L

## 2012-10-11 LAB — CULTURE, BLOOD (SINGLE)

## 2012-10-12 LAB — CBC WITH DIFFERENTIAL/PLATELET
HGB: 6.7 g/dL — ABNORMAL LOW (ref 12.0–16.0)
MCH: 27.7 pg (ref 26.0–34.0)
MCHC: 32.6 g/dL (ref 32.0–36.0)
MCV: 85 fL (ref 80–100)
RDW: 19.5 % — ABNORMAL HIGH (ref 11.5–14.5)
Segmented Neutrophils: 68 %
Variant Lymphocyte - H1-Rlymph: 4 %

## 2012-10-12 LAB — LACTATE DEHYDROGENASE: LDH: 211 U/L (ref 81–246)

## 2012-10-12 LAB — BASIC METABOLIC PANEL
Anion Gap: 10 (ref 7–16)
BUN: 59 mg/dL — ABNORMAL HIGH (ref 7–18)
Co2: 30 mmol/L (ref 21–32)
Creatinine: 2.3 mg/dL — ABNORMAL HIGH (ref 0.60–1.30)
EGFR (African American): 23 — ABNORMAL LOW
Osmolality: 299 (ref 275–301)

## 2012-10-12 LAB — FIBRINOGEN: Fibrinogen: 216 mg/dL (ref 210–470)

## 2012-10-12 LAB — FIBRIN DEGRADATION PROD.(ARMC ONLY): Fibrin Degradation Prod.: >10 — ABNORMAL HIGH (ref 2.1–7.7)

## 2012-10-12 LAB — APTT: Activated PTT: 32.3 secs (ref 23.6–35.9)

## 2012-10-12 LAB — PROTIME-INR: Prothrombin Time: 14.5 secs (ref 11.5–14.7)

## 2012-10-13 LAB — CBC WITH DIFFERENTIAL/PLATELET
HCT: 20.3 % — ABNORMAL LOW (ref 35.0–47.0)
HGB: 6.3 g/dL — ABNORMAL LOW (ref 12.0–16.0)
Lymphocytes: 18 %
Monocytes: 2 %
NRBC/100 WBC: 3 /
Platelet: 60 10*3/uL — ABNORMAL LOW (ref 150–440)
WBC: 9.1 10*3/uL (ref 3.6–11.0)

## 2012-10-13 LAB — BASIC METABOLIC PANEL
BUN: 57 mg/dL — ABNORMAL HIGH (ref 7–18)
Calcium, Total: 7.9 mg/dL — ABNORMAL LOW (ref 8.5–10.1)
Creatinine: 1.98 mg/dL — ABNORMAL HIGH (ref 0.60–1.30)
EGFR (Non-African Amer.): 23 — ABNORMAL LOW
Glucose: 59 mg/dL — ABNORMAL LOW (ref 65–99)
Potassium: 3.1 mmol/L — ABNORMAL LOW (ref 3.5–5.1)
Sodium: 145 mmol/L (ref 136–145)

## 2012-10-13 LAB — OCCULT BLOOD X 1 CARD TO LAB, STOOL: Occult Blood, Feces: POSITIVE

## 2012-10-24 ENCOUNTER — Ambulatory Visit: Payer: Self-pay | Admitting: Oncology

## 2012-11-23 DEATH — deceased

## 2013-02-26 IMAGING — CT CT ABD-PELV W/O CM
1 of 2 series · 14 of 32 positions shown, 18 images · non-contrast
Comparison: none

REASON FOR EXAM: (1) R/o colitis, hypotension.; (2) R/o colitis/ abscess,
hypotension, acidosis.
COMMENTS:

[Series 3: 3mm soft tissue · axial · 0.79mm/px · z∈[-914,-524]mm · 14 of 142 slices shown, 18 images]
[im 6/142  soft-tissue]
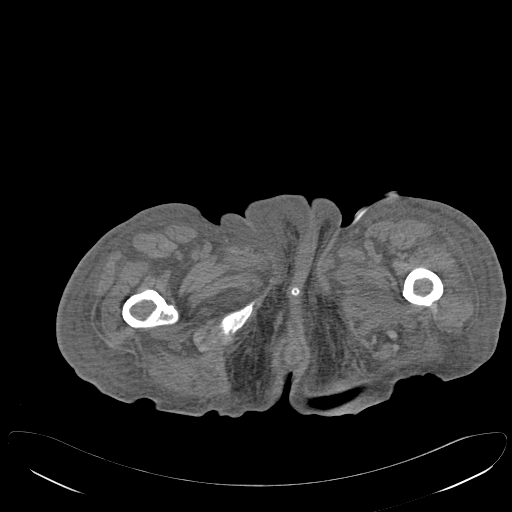
[im 6/142  bone]
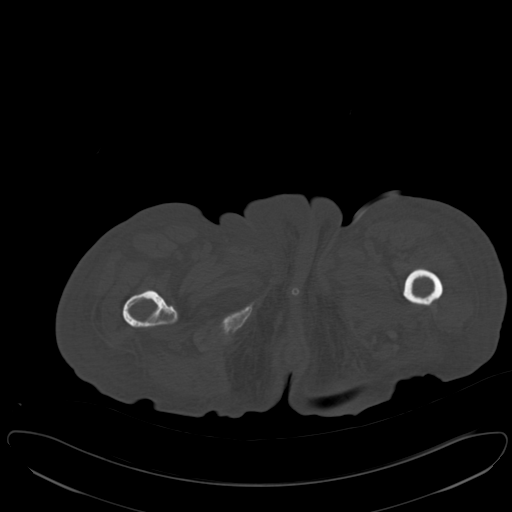
[im 18/142  soft-tissue]
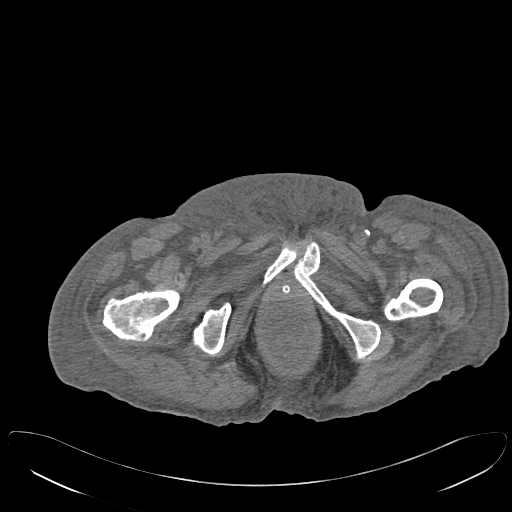
[im 30/142  soft-tissue]
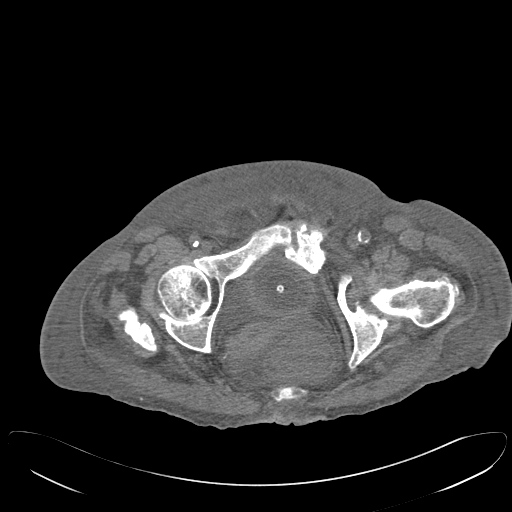
[im 42/142  soft-tissue]
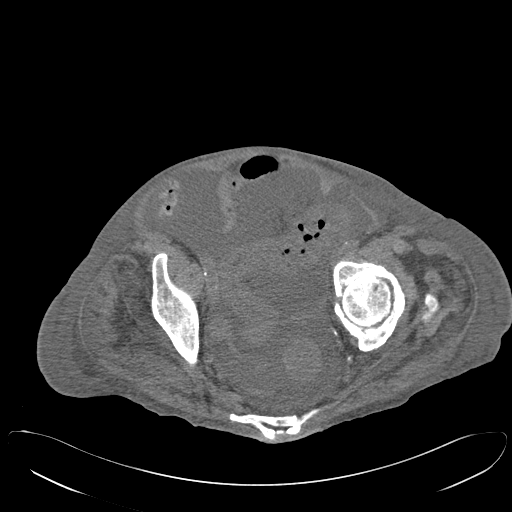
[im 53/142  soft-tissue]
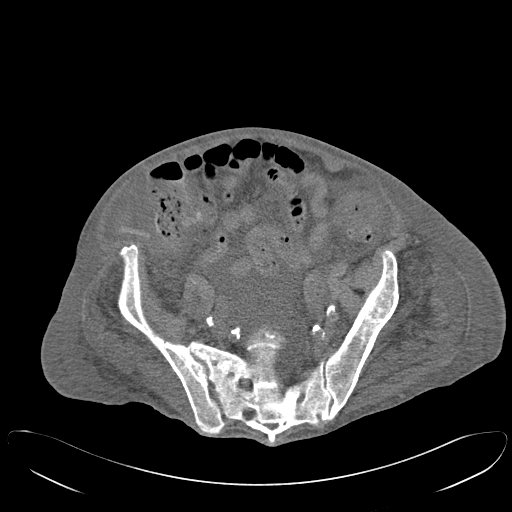
[im 65/142  soft-tissue]
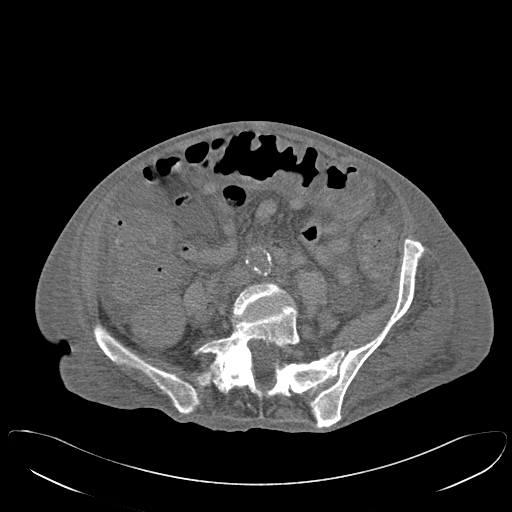
[im 77/142  soft-tissue]
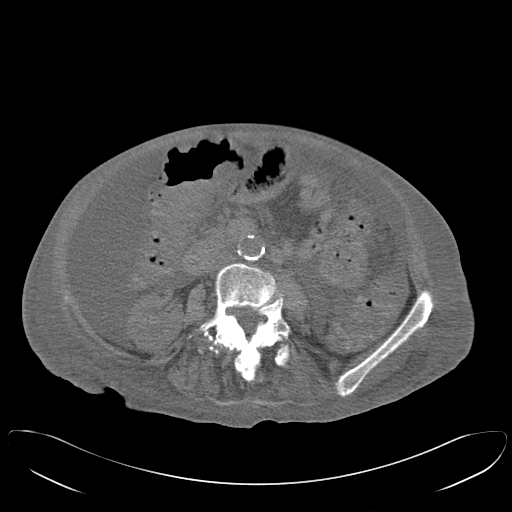
[im 89/142  soft-tissue]
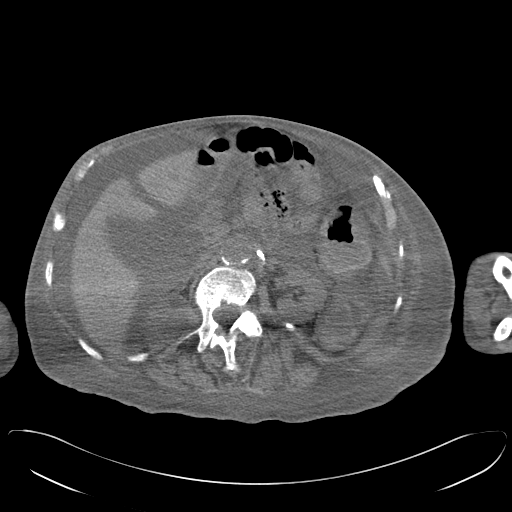
[im 100/142  soft-tissue]
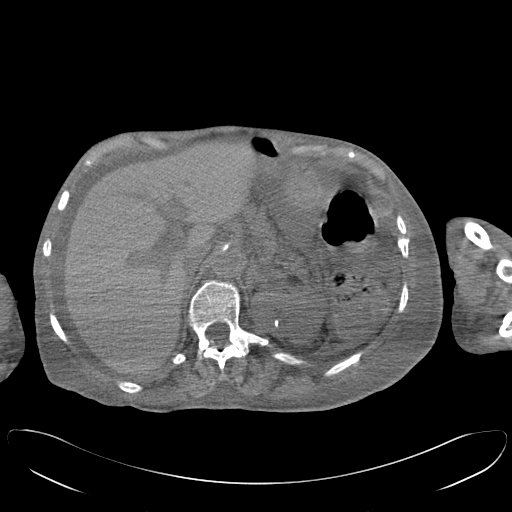
[im 100/142  bone]
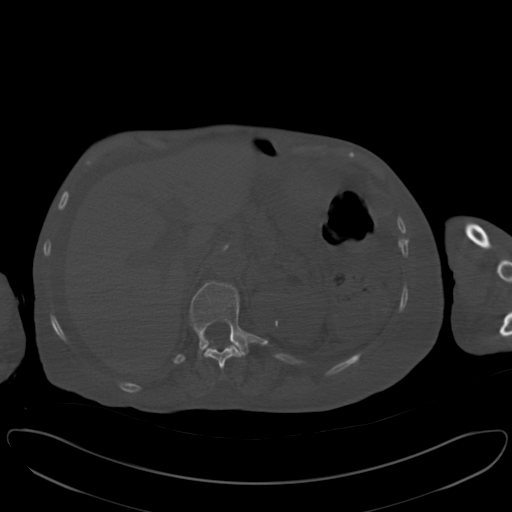
[im 112/142  soft-tissue]
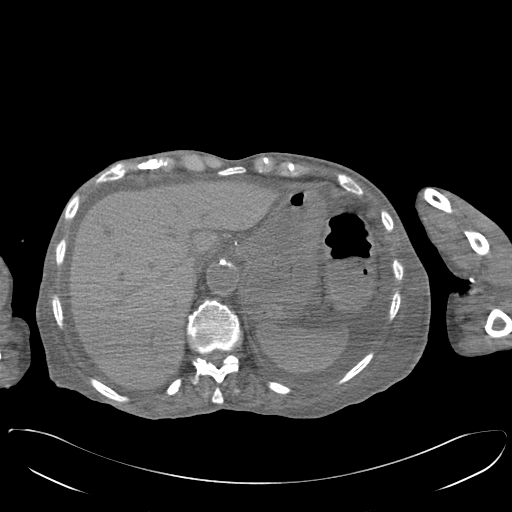
[im 118/142  lung]
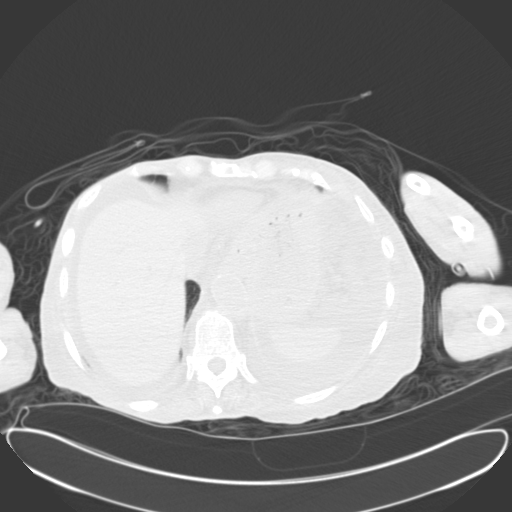
[im 124/142  soft-tissue]
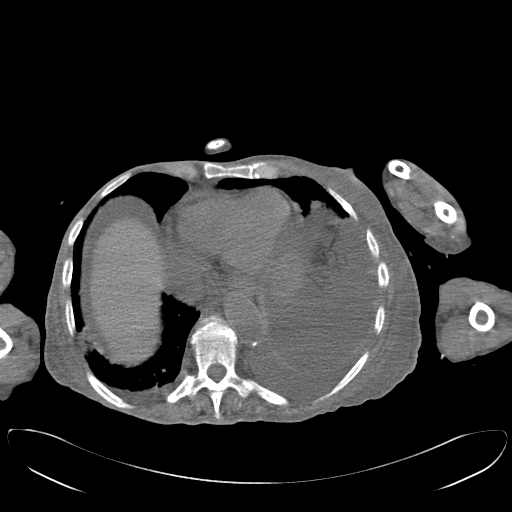
[im 124/142  lung]
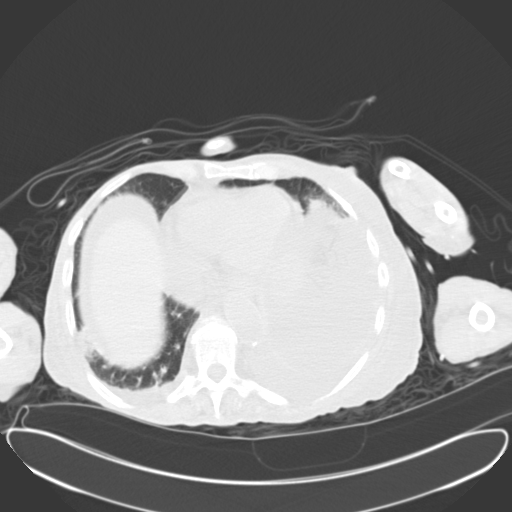
[im 130/142  lung]
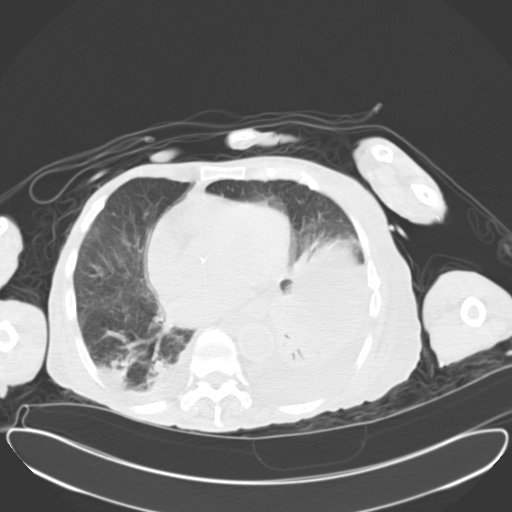
[im 136/142  soft-tissue]
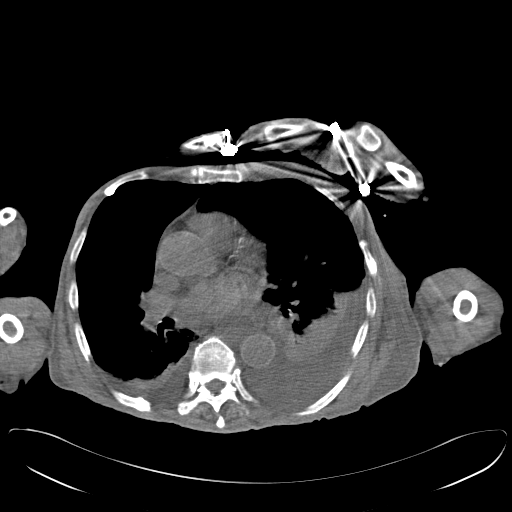
[im 136/142  lung]
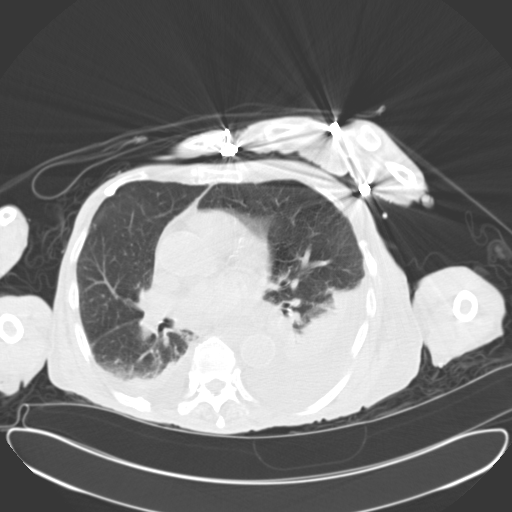

[14 of 32 positions shown; findings below may reference images not displayed]

PROCEDURE:     CT  - CT ABDOMEN AND PELVIS W[DATE]  [DATE]

RESULT:     Axial noncontrast CT scanning was performed through the abdomen
and pelvis with reconstructions at 3 mm intervals and slice thicknesses.
Review of multiplanar reconstructed images was performed separately on the
VIA monitor.

There is a large amount of ascites within the abdomen and pelvis. The liver
does not appear shrunken nor is its surface irregular. No hepatic masses are
demonstrated. The gallbladder may be distended though its wall does not
appear thickened. The stomach is only partially distended. The spleen is not
enlarged. The pancreas cannot be well evaluated but no gross abnormality is
demonstrated. The kidneys exhibit no evidence of obstruction. There is
hypodensity in the midpole of the left kidney likely reflective of a cyst.
No stones are demonstrated in either kidney.

There is dense callus along the abdominal aorta but there is no evidence of
an aneurysm

The unopacified loops of small and large bowel exhibit no evidence of
obstruction or definite evidence of an ileus. The rectosigmoid colon is
incompletely distended and thus wall thickening cannot be excluded. There is
no objective evidence of abscess formation or perforation. The uterus is
small appropriate for age. The partially distended urinary bladder contains
a Foley catheter.

There is a moderate size left pleural effusion and small right pleural
effusion.
IMPRESSION: 1. The study is quite limited without oral or intravenous contrast material.
There are findings which may reflect anasarca manifested by pleural
effusions, a large amount of ascites, and diffusely increased density in the
subcutaneous soft tissues.
2. There is no evidence of bowel obstruction or ileus or objective evidence
of acute colitis.
3. There is no evidence of urinary tract obstruction nor of calcified
urinary tract stones.
4. No definite acute abnormality of the hepatobiliary tree though the
gallbladder may be mildly distended possibly related to the fasting state.

[REDACTED]

## 2015-04-12 NOTE — Consult Note (Signed)
Referring Physician:  Baxter Hire :   Primary Care Physician:  Baxter Hire : [facility] [place], [facility], [place]. [address], [place], [place] [address], [place]  Reason for Consult:  Admit Date: 06-Oct-2012   Chief Complaint: altered mental status   Reason for Consult: altered mental status   History of Present Illness:  History of Present Illness:   81 yo [facility] SN[facility] female resident who presents with AMS.  Pt was improving the other day but then got some Haldol and was unresponsive yesterday.  Today she is a lot more alert and feisty.  Old caregiver is in the room and states that she is normally unhappy like this and she is at baseline compared to previous.  ROS:   Review of Systems   pt refuses to answer most questions  Past Medical/Surgical Hx:  scoliosis:   depression:   osteoarthritis:   weight loss:   Breast Cancer - 2004:   HTN:   partial hysterecyomy:   Past Medical/ Surgical Hx:   Past Medical History PAST MEDICAL HISTORY:  1. Myelodysplastic syndrome.  2. Anemia requiring frequent blood transfusions.  3. Estrogen receptor positive breast cancer, currently on tamoxifen.  4. Osteoporosis.  5. History of GI bleeding.  6. Very hard of hearing.  7.           Dementia  PAST SURGICAL HISTORY:  1. Right mastectomy.  2. Appendectomy.  3. Removal of ovarian cyst.    Past Surgical History as above   Home Medications: Medication Instructions Last Modified Date/Time  acetaminophen-hydrocodone 500 mg-5 mg oral tablet 1 tab(s) orally every 4 hours, As Needed- for Pain  13-Oct-13 20:03  Aricept 10 mg oral tablet 1 tab(s) orally once a day (at bedtime) 13-Oct-13 20:03  nicotine 1 patch transdermal once a day 13-Oct-13 20:03  trazodone 50 mg oral tablet 1 tab(s) orally once a day (at bedtime) 13-Oct-13 22:14  pantoprazole 40 mg oral delayed release tablet 1 tab(s) orally 2 times a day 13-Oct-13 22:14  tamoxifen 20 mg oral tablet 1 tab(s) orally  once a day 13-Oct-13 22:14   Allergies:  Peanuts: Other  Social/Family History:  Employment Status: retired   Lives With: alone   Living Arrangements: extended care facility   Social History: tob abuse, no EtOH, no illicits, lives in [address] History: n/c   Vital Signs: **Vital Signs.:   18-Oct-13 09:25   Vital Signs Type Routine   Temperature Temperature (F) 98.4   Celsius 36.8   Temperature Source Oral   Pulse Pulse 92   Respirations Respirations 16   Systolic BP Systolic BP 91   Diastolic BP (mmHg) Diastolic BP (mmHg) 60   Mean BP 70   Pulse Ox % Pulse Ox % 94   Pulse Ox Activity Level  At rest   Oxygen Delivery 4L; Nasal Cannula   Physical Exam:  General: cachetic, no acute distress   HEENT: normocephalic, sclera nonicteric, oropharynx clear   Neck: supple, no JVD, no bruits   Chest: CTA B, no wheezes, good movement   Cardiac: RRR, no murmurs, trace edema   Extremities: no C/C,  FROM   Neurologic Exam:  Mental Status: alert but oriented only to person, this is limited because pt will not follow with most of exam, no obvious aphasia, pt is especially good at cursing and purposely not following, no obvious dysarthria   Cranial Nerves: PERRLA, EOMI, nl VF, face symmetric, tongue midline, shoulder shrug [place]  Motor Exam: moves all 4 equally, nl tone, no tremor   Deep Tendon Reflexes: 1+/4 symmetric, unable to do Plantars   Sensory Exam: will not cooperate   Coordination: will not cooperate   Lab Results: Hepatic:  13-Oct-13 20:28    Bilirubin, Total 0.3   Alkaline Phosphatase  49   SGPT (ALT) 13   SGOT (AST) 22   Total Protein, Serum  5.5   Albumin, Serum  1.9  LabUnknown:  14-Oct-13 04:03    Result Interpretation Smear review reveals anisopoikilocytosis, microcytes, targets, teardrops and elliptocytes. Patient has a history of MDS. Clinical correlation is recommended. Dr. Luana Shu.  Routine Micro:  13-Oct-13 20:28    Specimen Source IN/OUT CATH     22:07    Culture Comment NO GROWTH IN 36 HOURS  Result(s) reported on 07 Oct 2012 at 07:05AM.  14-Oct-13 00:39    Micro Text Report CLOS.DIFF ASSAY, RT-PCR   COMMENT                   POSITIVE-CLOS.DIFFICILE TOXIN DETECTED BY PCR   ANTIBIOTIC                        Clostridium Diff Toxin by RT-PCR POSITIVE-CLOS.DIFFICILE TOXIN DETECTED BY PCR ---------------------------------- Test procedure integrates sample purification, nucleic acid amplification, and detection of the target Clostridium difficile sequence in simple or complex samples using real-time PCR and RT-PCR assays.  Routine Chem:  13-Oct-13 20:28    Hemoglobin A1c ([facility])  7.0 (The American Diabetes Association recommends that a primary goal of therapy should be <7% and that physicians should reevaluate the treatment regimen in patients with HbA1c values consistently >8%.)  09-[facility]-82 99:37    Folic Acid, Serum 16.9 (Result(s) reported on 10 Oct 2012 at 05:42AM.)   Ammonia, Plasma < 25 (Result(s) reported on 10 Oct 2012 at 05:47AM.)   Glucose, Serum  58   BUN  67   Creatinine (comp)  2.89   Sodium, Serum 141   Potassium, Serum 4.0   Chloride, Serum 102   CO2, Serum 31   Calcium (Total), Serum  7.5   Anion Gap 8   Osmolality (calc) 298   eGFR (African American)  17   eGFR (Non-African American)  15 (eGFR values <25m/min/1.73 m2 may be an indication of chronic kidney disease (CKD). Calculated eGFR is useful in patients with stable renal function. The eGFR calculation will not be reliable in acutely ill patients when serum creatinine is changing rapidly. It is not useful in  patients on dialysis. The eGFR calculation may not be applicable to patients at the low and high extremes of body sizes, pregnant women, and vegetarians.)   Magnesium, Serum 2.1 (1.8-2.4 THERAPEUTIC RANGE: 4-7 mg/dL TOXIC: > 10 mg/dL  -----------------------)   Result Comment platelet count - SLIGHT PLATELET CLUMPING IN SPECIMEN. ACTUAL  -  NUMERICAL COUNT MAY BE SOMEWHAT HIGHER THAN  - THE REPORTED VALUE. wbc count  - VERIFIED BY SMEAR ESTIMATE diff/nrbc - SLIDE PREVIOUSLY REVIEWED BY PATHOLOGIST  - mpg 10/10/12  Result(s) reported on 10 Oct 2012 at 06:34AM.  Misc Urine Chem:  14-Oct-13 23:02    Creatinine, Urine  169.2   Protein, Random Urine  62   Protein/Creat Ratio (comp)  366 (Result(s) reported on 07 Oct 2012 at 12:02AM.)  Routine UA:  14-Oct-13 23:02    Color (UA) Amber   Clarity (UA) Cloudy   Glucose (UA) Negative   Bilirubin (UA) Negative   Ketones (UA)  Negative   Specific Gravity (UA) 1.021   Blood (UA) Negative   pH (UA) 5.0   Protein (UA) Negative   Nitrite (UA) Negative   Leukocyte Esterase (UA) Negative (Result(s) reported on 06 Oct 2012 at 11:19PM.)   RBC (UA) 9 /HPF   WBC (UA) 9 /HPF   Bacteria (UA) TRACE   Epithelial Cells (UA) 2 /HPF   Mucous (UA) PRESENT   Hyaline Cast (UA) 27 /LPF   Amorphous Crystal (UA) PRESENT (Result(s) reported on 06 Oct 2012 at 11:19PM.)  Routine Sero:  15-Oct-13 14:18    Occult Blood, Feces POSITIVE (Result(s) reported on 07 Oct 2012 at 02:31PM.)  Routine Hem:  17-Oct-13 08:24    Diff Comment 10 ELLIPTOCYTES  Result(s) reported on 09 Oct 2012 at 11:44AM.  18-Oct-13 04:28    WBC (CBC) 10.3   RBC (CBC)  2.67   Hemoglobin (CBC)  7.3   Hematocrit (CBC)  23.0   Platelet Count (CBC)  80   MCV 86   MCH 27.2   MCHC  31.6   RDW  19.2   Bands 2   Segmented Neutrophils 77   Lymphocytes 16   Variant Lymphocytes 1   Monocytes 1   Metamyelocyte 2   Myelocyte 1   NRBC 40   Diff Comment 1 ANISOCYTOSIS   Diff Comment 2 POIKILOCYTOSIS   Diff Comment 3 POLYCHROMASIA   Diff Comment 4 MICROCYTES PRESENT   Diff Comment 5 ELLIPTOCYTES   Diff Comment 6 TEARDROP CELLS   Diff Comment 7 DOHLE BODIES   Diff Comment 8 TARGET CELLS   Diff Comment 9 PLTS VARIED IN SIZE  Result(s) reported on 10 Oct 2012 at 06:34AM.   Radiology Impression:  Radiology Impression: CXR  personally reviewed by me and does not show any pneumonia   Impression/Recommendations:  Recommendations:   labs reviewed and noted + c.diff and worsening renal function d/w referring physician   Encephalopathy-  most likely medication induced on top of toxic/metabolic and infectious encephalopathy from current medical issues;  there are no focal findings Dementia-  appears to be significant and pt could sundown making them look acutely worse Sepsis-  improved with fluids  avoid typical neuroleptics as this can worsen pt PRN seroquel 11m PO BID  or Geodon 135m IV BID for agitation keep lights on during the day and pt should be up to chair TID continue to treat underlying medical infections continue Aricept at current dose check B12 and TSH and supplement as necessary for defiencies will sign off, please call with questions  Electronic Signatures: SmJamison NeighborMD)  (Signed 18-Oct-13 11:31)  Authored: REFERRING PHYSICIAN, Primary Care Physician, Consult, History of Present Illness, Review of Systems, PAST MEDICAL/SURGICAL HISTORY, HOME MEDICATIONS, ALLERGIES, Social/Family History, NURSING VITAL SIGNS, Physical Exam-, LAB RESULTS, RADIOLOGY RESULTS, Recommendations   Last Updated: 18-Oct-13 11:31 by SmJamison NeighborMD)

## 2015-04-12 NOTE — Discharge Summary (Signed)
PATIENT NAME:  Catherine Mcintosh, Catherine Mcintosh MR#:  [id number] DATE OF BIRTH:  03/15/1930  DATE OF ADMISSION:  07/14/2012 DATE OF DISCHARGE:  07/18/2012  FINAL DIAGNOSES:  1. Anemia requiring blood transfusion secondary to gastrointestinal bleeding. Erosive gastric ulcer secondary to use of nonsteroidal medication. Underlying problem is myelodysplastic syndrome. Neutropenia secondary to myelodysplastic syndrome.  2. Severe bony pain most likely secondary to multiple bony fractures secondary to trauma versus metastatic disease cannot be ruled out.  3. History of carcinoma of breast.  4. Osteoporosis.   OVERALL PROGNOSIS: Guarded.   PROCEDURE: Upper endoscopy revealing erosive gastritis and ulcers.   DISCHARGE MEDICATIONS:  1. Prilosec 20 mg p.o. twice a day. 2. Tamoxifen 20 mg p.o. daily.  3. Vicodin 1 tablet every 4 to 6 hours p.r.n. for pain.  4. The patient was advised to get physical therapy and occupational therapy to help with ambulation and taking self-care.  5. The patient is not supposed to take any nonsteroidal medication like Aleve, ibuprofen, or aspirin.  6. Stool softener and MiraLax as needed.   DIET: Regular.   ACTIVITY: As tolerated.   HISTORY OF PRESENT ILLNESS: The patient was brought to my office feeling extremely weak, tired. Family reported dark stool.   Hemoglobin in the office was noticed to be 5.5 grams, on 07/14/2012. In view of this finding, the patient was admitted in the hospital for further evaluation and treatment consideration.   IMPORTANT LAB DATA: On 07/14/2012 hemoglobin was 7.4 (after 2 units of packed cell transfusion).   Creatinine 1.17. Ferritin 747. Folic acid 19.1. Iron saturation 61%, serum iron 161. LDH 303. Retic count 2.54. Vitamin B12 level 1999.   Chest x-ray: Multiple rib fractures.   Bone scan with multiple areas of increased uptake most likely multiple fractures versus metastatic disease.  MRI scan of spine was consistent with traumatic fractures of  the spine.   HOSPITAL COURSE: During the hospital stay, the patient received blood transfusion. The patient was found to have a urinary tract infection with Escherichia coli and was started on Cipro. Physical therapy and occupational therapy was started. I had a prolonged discussion with the family. There is no way to prove that what we are dealing with in the bone is traumatic fracture as the patient has multiple falls versus metastatic disease. However, I consider possibility of metastatic disease is remote, but we will put the patient on tamoxifen for a while. The patient and family decided to go to skilled nursing division for rehab therapy as the patient lives by herself, she is somewhat confused and has frequent falls. Her overall prognosis is poor because of her age, multiple medical problems, and myelodysplastic syndrome requiring blood transfusion. ____________________________ Gerome SamJanak K. Marieelena Bartko, MD jkc:slb D: 07/18/2012 15:37:27 ET T: 07/18/2012 16:03:56 ET JOB#: [id number]  cc: Valarie ConesJanak K. Doylene Canninghoksi, MD, <Dictator> Laddie AquasJANAK K Afnan Emberton MD ELECTRONICALLY SIGNED 07/21/2012 8:02

## 2015-04-12 NOTE — Consult Note (Signed)
Full consult to follow. Pt with hx of breast cancer. Anemia. Melena x 1 month. Also, on Fe at home. Has hx of colon polyps but only diverticulosis seen on 1/11 colonoscopy. Pt already ate today. Thus, EGD cannot be performed. [facility] Since patient wants EGD done immediately, will have another GI perform EGD tomorrow afternoon. THanks.  Electronic Signatures: Lutricia Feilh, Cache Decoursey (MD)  (Signed on 22-Jul-13 12:50)  Authored  Last Updated: 22-Jul-13 12:50 by Lutricia Feilh, Fabiha Rougeau (MD)

## 2015-04-12 NOTE — Consult Note (Signed)
Comments   I met with pt's niece/HCPOA. She understands that pt is not improving despite aggressive medical therapy. She also realizes that pt is tired of being in hospital, getting labs, etc. She agrees with pt returning to [facility] with hospice services. Will try to arrange for tomorrow. CM aware. Hospice screen ordered.   Electronic Signatures: Audianna Landgren, Izora Gala (MD)  (Signed 21-Oct-13 16:22)  Authored: Palliative Care   Last Updated: 21-Oct-13 16:22 by Marte Celani, Izora Gala (MD)

## 2015-04-12 NOTE — Consult Note (Signed)
Chief Complaint:   Subjective/Chief Complaint Patient seen for Dr Candace Cruise.  [place] admitted with profound anemia and melena. no nausea or vomitingovernight, mild abdominal pain in the low eipgastrum and luq.   VITAL SIGNS/ANCILLARY NOTES: **Vital Signs.:   23-Jul-13 04:22   Vital Signs Type Routine   Temperature Temperature (F) 97.6   Celsius 36.4   Temperature Source Oral   Pulse Pulse 49   Respirations Respirations 18   Systolic BP Systolic BP 570   Diastolic BP (mmHg) Diastolic BP (mmHg) 64   Mean BP 86   Pulse Ox % Pulse Ox % 94   Pulse Ox Activity Level  At rest   Oxygen Delivery Room Air/ 21 %    12:44   Vital Signs Type Routine   Temperature Temperature (F) 97.4   Celsius 36.3   Temperature Source Oral   Pulse Pulse 49   Respirations Respirations 18   Systolic BP Systolic BP 177   Diastolic BP (mmHg) Diastolic BP (mmHg) 55   Mean BP 74   Pulse Ox % Pulse Ox % 98   Brief Assessment:   Cardiac Regular    Respiratory clear BS    Gastrointestinal details normal Soft  Nondistended  No masses palpable  Bowel sounds normal  No rebound tenderness  mild tenderness left side of luq.    Additional Physical Exam patient oriented to person time and place.   Lab Results: Hepatic:  23-Jul-13 06:17    Bilirubin, Total 0.9   Alkaline Phosphatase 79   SGPT (ALT) 14 (12-78 NOTE: NEW REFERENCE RANGE 11/16/2011)   SGOT (AST)  14   Total Protein, Serum  6.3   Albumin, Serum  2.5  Routine Chem:  23-Jul-13 06:17    Result Comment wbc/plt - PLATELET CLUMPING IN SPECIMEN. RESULTS  - OBTAINED FROM CITRATE TUBE. plt - plt clumps on edta tube  Result(s) reported on 15 Jul 2012 at 10:28AM.   Glucose, Serum 77   BUN  32   Creatinine (comp) 0.96   Sodium, Serum 144   Potassium, Serum 3.9   Chloride, Serum  111   CO2, Serum 24   Calcium (Total), Serum  8.4   Osmolality (calc) 293   eGFR (African American) >60   eGFR (Non-African American)  56 (eGFR values <46m/min/1.73 m2 may  be an indication of chronic kidney disease (CKD). Calculated eGFR is useful in patients with stable renal function. The eGFR calculation will not be reliable in acutely ill patients when serum creatinine is changing rapidly. It is not useful in  patients on dialysis. The eGFR calculation may not be applicable to patients at the low and high extremes of body sizes, pregnant women, and vegetarians.)   Anion Gap 9  Routine Hem:  23-Jul-13 06:17    WBC (CBC)  2.7   RBC (CBC)  3.28   Hemoglobin (CBC)  9.6   Hematocrit (CBC)  30.4   Platelet Count (CBC) 195   MCV 93   MCH 29.1   MCHC  31.5   RDW  20.8   Segmented Neutrophils 27   Lymphocytes 69   Monocytes 3   Eosinophil 1   Diff Comment 1 ANISOCYTOSIS   Diff Comment 2 POIKILOCYTOSIS   Diff Comment 3 ELLIPTOCYTES   Diff Comment 4 PLTS VARIED IN SIZE  Result(s) reported on 15 Jul 2012 at 10:28AM.   Radiology Results: Cardiology:    22-Jul-13 13:01, ECG   ECG interpretation    Normal sinus rhythm  Normal ECG  When compared with ECG of 03-Mar-2012 13:01,  No significant change was found  ----------unconfirmed----------  Confirmed by OVERREAD, NOT ([id number]), editor PEARSON, BARBARA ([id number]) on 07/14/2012 2:27:01 PM   Assessment/Plan:  Assessment/Plan:   Assessment 1) melena, anemia- s/p tfx, hemodynamically stable no evidence of recurrentl bleeding.  2) breast cancer, htn    Plan 1) EGD today.  I have discussed the risks benefits and complicatiosn of egd to include not limited to bleeding infection perforation and sedation and she wishes to proceed.   Electronic Signatures: Loistine Simas (MD)  (Signed 23-Jul-13 17:07)  Authored: Chief Complaint, VITAL SIGNS/ANCILLARY NOTES, Brief Assessment, Lab Results, Radiology Results, Assessment/Plan   Last Updated: 23-Jul-13 17:07 by Loistine Simas (MD)

## 2015-04-12 NOTE — Consult Note (Signed)
Chief Complaint:   Subjective/Chief Complaint Please see EGD report.  Multiple erosions/ulcers in the gastric antrum, one with possible stigmata.  Patietn reports taking multiple advils daily for over a month for pain.  Will start on full liquids, advance to low residue in am.  NO asa/nsaids.  Will need alternative pain med for musculoskeletal complaints.   Will check h. pylori serology.  Dr Bluford Kaufmannh to return tomorrow.   Electronic Signatures: Barnetta ChapelSkulskie, Azlaan Isidore (MD)  (Signed 23-Jul-13 17:36)  Authored: Chief Complaint   Last Updated: 23-Jul-13 17:36 by Barnetta ChapelSkulskie, Dakari Stabler (MD)

## 2015-04-12 NOTE — Consult Note (Signed)
PATIENT NAME:  Catherine Mcintosh, Catherine Mcintosh MR#:  [id number] DATE OF BIRTH:  May 25, 1930  DATE OF CONSULTATION:  10/07/2012  REFERRING PHYSICIAN:  Altamese Dilling, MD CONSULTING PHYSICIAN:  Rosalyn Gess. Maryem Shuffler, MD  REASON FOR CONSULTATION: Clostridium difficile and possible pneumonia.   HISTORY OF PRESENT ILLNESS: The patient is an 81 year old black Mcintosh with a past history significant for myelodysplastic disease and recent pneumonia who presented to the hospital on 10/05/2012 with decreased p.o. intake and dehydration. She is unable to provide any history due to her current mental status and the history is obtained exclusively from the chart. Per the history and physical, she apparently was diagnosed with pneumonia and was given treatment with vancomycin and clindamycin (which would provide absolutely no gram-negative rod coverage). She was also noted to have enterococcus in the urine. She was given vancomycin for this as well. Over the few days prior to admission she had not been eating and was not as active and was brought to the Emergency Room. In the Emergency Room, she was found to have elevated blood sugars, acute renal failure, and an infiltrate on x-ray. Her work-up so far has included a Clostridium difficile PCR which is positive. She has been started on Zosyn, Levaquin, and oral vancomycin. Her urinalysis was unremarkable.   ALLERGIES: Peanuts.  PAST MEDICAL HISTORY:  1. Myelodysplastic disorder.  Catherine. Chronic anemia.  3. Breast cancer status post mastectomy, currently on tamoxifen.  4. Osteoporosis.  5. Gastrointestinal bleeding.  6. Status post appendectomy.  7. Status post ovarian cyst removal.   SOCIAL HISTORY: The patient is a nursing home resident. She smokes 1/Catherine pack of cigarettes per day. No further history is available.   FAMILY HISTORY: Positive for bone cancer in her brother.  REVIEW OF SYSTEMS: Unable to obtain from the patient due to her current mentation.   PHYSICAL  EXAMINATION:   VITAL SIGNS: T-max 99.1, T-current 98.1, pulse 97, blood pressure 89/58, and 93% on Catherine liters.   GENERAL: An Catherine Mcintosh, cachectic, in no acute distress but markedly confused.   HEENT: Pupils are equal and reactive to light. Unable to assess extraocular motion. Sclerae, conjunctivae, and lids do not appear to contain any emboli or petechiae. Oropharynx is somewhat difficult to examine although her lips and gums appear to be in good condition.   NECK: Midline trachea. No lymphadenopathy. No thyromegaly.   LUNGS: Clear to auscultation bilaterally. Good air movement. No focal consolidation.   HEART: Regular rate and rhythm without murmur, rub, or gallop.   ABDOMEN: Soft. No hepatosplenomegaly. No hernia is noted. She did not appear to have any tenderness.   EXTREMITIES: No evidence for tenosynovitis.   SKIN: No rashes. No stigmata of endocarditis, specifically no Janeway lesions or Osler nodes.   NEUROLOGIC: The patient was confused. She was not responding to questions. She would not make eye contact. She was moving all four extremities and moaning, however.   PSYCHIATRIC: Unable to assess.   LABS/RADIOLOGIC STUDIES:  BUN 70, creatinine 3.93, bicarbonate 14, and anion gap 16. White count 38.7, hemoglobin 7.5, platelet count 182, 79% neutrophils, and 8% bands. Her white count on admission was 40.0. Her white count prior to this hospitalization, on 09/09/2012, was Catherine.Catherine with a hemoglobin of 6.6 with 31% neutrophils and Catherine% bands. In reviewing white counts over the last several years her white count has ranged from normal to below normal and has had no significant elevations. Her liver function tests on admission were unremarkable.   Blood cultures from admission  show no growth.   C. difficile PCR is positive.   A urinalysis was unremarkable with a negative urine culture.   Lactic acid was 3.90.   Chest x-ray from admission showed possible left lower lobe  pneumonia with a small left pleural effusion.   IMPRESSION: An 81 year old black Mcintosh with a history of myelodysplastic syndrome admitted with new onset diabetes, C. difficile colitis, and possible pneumonia.   RECOMMENDATIONS:  1. She presented with decreased p.o. intake, dehydration, acute renal failure, and elevated blood sugar and new-onset diabetes can certainly explain her symptoms.  Catherine. She is not mentating well. She is not hypoxic, however. There is concern over aspiration. She had an infiltrate on chest x-ray and apparently was treated for pneumonia recently.  3. It is difficult to determine whether it is actually affecting her now. The infiltrate could be from her prior pneumonia that has been treated (as x-rays often lag behind clinical improvement) or it could be from a more recent aspiration event. Treating her broadly however could make her C. difficile worse and lead to further dehydration and renal failure. Her mild dysplastic disease makes interpretation of her leukocytosis unreliable, but typically in the past her white count has been low. It is possible that she has developed acute leukemia, but C. difficile can certainly increase the white count.  4. Her Clostridium difficile PCR is positive and her blood sugar is high. I would focus on these issues first.  5. We will continue vancomycin p.o.. We will stop the Levaquin and the Zosyn. If she worsens from a respiratory point of view, I would add back the Zosyn only. Aspiration is often a chemical pneumonitis rather than an infection, but continued broad-spectrum antibiotics would make it harder for her to resolve her C. difficile.  6. Recommend speech evaluation for swallowing.  7. I would continue isolation.         This is a moderately complex infectious disease consult. Thank you very much for involving me in Catherine Mcintosh' care. ____________________________ Rosalyn GessMichael E. Conn Trombetta, MD meb:slb D: 10/07/2012 14:23:28  ET T: 10/07/2012 15:09:01 ET JOB#: [id number]  cc: Rosalyn GessMichael E. Harue Pribble, MD, <Dictator> Mahina Salatino E Eliberto Sole MD ELECTRONICALLY SIGNED 10/08/2012 8:21

## 2015-04-12 NOTE — Consult Note (Signed)
Chief Complaint:   Subjective/Chief Complaint Feeling better. No mention of melena. Some LUQ/flank pain.   VITAL SIGNS/ANCILLARY NOTES: **Vital Signs.:   24-Jul-13 14:16   Vital Signs Type Routine   Temperature Temperature (F) 98   Celsius 36.6   Temperature Source Oral   Pulse Pulse 63   Respirations Respirations 20   Systolic BP Systolic BP 114   Diastolic BP (mmHg) Diastolic BP (mmHg) 62   Mean BP 79   Pulse Ox % Pulse Ox % 93   Pulse Ox Activity Level  At rest   Oxygen Delivery Room Air/ 21 %   Brief Assessment:   Cardiac Regular    Respiratory normal resp effort    Gastrointestinal Normal   Assessment/Plan:  Assessment/Plan:   Assessment Gastric ulcers/erosions. Hgb stable.    Plan Continue PPI daily x min 6-8 wks. Avoid ASA/NDSAIDS. Hopefully, discharge soon. Will sign off. Thanks   Electronic Signatures: Lutricia Feilh, Moncia Annas (MD)  (Signed 24-Jul-13 15:23)  Authored: Chief Complaint, VITAL SIGNS/ANCILLARY NOTES, Brief Assessment, Assessment/Plan   Last Updated: 24-Jul-13 15:23 by Lutricia Feilh, Raygen Dahm (MD)

## 2015-04-12 NOTE — Consult Note (Signed)
    Comments   Called by RN to see pt. SBP 70's, pt unresponsive. Pt's BP now back up to 120's. Eyes open but usual difficulty with communication due to hearing loss and altered mental status. I called Ronny BaconEvangeline Turner, pt's niece & decision-maker. Ms Mayford Knifeurner has not seen pt today. I updated her on pt's condition and recent events. Once again explained what being a full code entails and suggested that niece might want to reconsider. After much discussion, niece says that she understands there might be more harm than benefit and agrees with DNR. Order entered.   Electronic Signatures: Alioune Hodgkin, Harriett SineNancy (MD)  (Signed 17-Oct-13 16:28)  Authored: Palliative Care   Last Updated: 17-Oct-13 16:28 by Trulee Hamstra, Harriett SineNancy (MD)

## 2015-04-12 NOTE — Consult Note (Signed)
Impression: 82yo BF w/ h/o myelodysplastic syndrome admitted with new onset DM, C. diff colitis and possible pneumonia.  She presented with decreased PO intake, dehydration, acute renal failure and elevated BS.  New onset diabetes could certainly explain her symptoms. She is not mentating well.  She is not hypoxic.  There is concern over aspiration.  She has an infiltrate on CXR, but was apparently treated for pneumonia recently. It is difficult to determine what is actually affecting her now.  The infiltrate could be from her prior pneumonia that has been treated (as xrays often lag behind clinical improvement) or could be from a more recent aspiration event.  Treating her broadly, however, could make her C. diff worse and lead to further dehydration and renal failure.  Her myelodysplastic disease makes interpretation of her leukocytosis unreliable, but typically in the past her WBC has been low.  It is possible that she has developed acute leukemia, but C. diff can certainly increase the WBC.Marland Kitchen. Her C. diff PCR is positive and her BS is high.  I would focus on these issues first. Will continue vanco po.  Stop levofloxacin and zosyn.  If she worsens from a respiratory point of view, would add back the zosyn only.  Aspiration is often a chemical pneumonitis rather than an infection, but continued broad spectrum antibiotics will make it harder for her to resolve her C. diff. Speech evaluation for swallowing. Continue isolation.   Electronic Signatures: Susen Haskew, Rosalyn GessMichael E (MD) (Signed on 15-Oct-13 14:14)  Authored   Last Updated: 15-Oct-13 14:23 by Shaquayla Klimas, Rosalyn GessMichael E (MD)

## 2015-04-12 NOTE — H&P (Signed)
PATIENT NAME:  Catherine Mcintosh, Catherine Mcintosh MR#:  [id number] DATE OF BIRTH:  05-Mar-1930  DATE OF ADMISSION:  10/05/2012  CHIEF COMPLAINT: Not eating.   HISTORY OF PRESENT ILLNESS: This is an 81 year old female who is a nursing home patient. She has a history of a myelodysplastic disorder. She was started on treatment about two weeks ago for pneumonia which included vancomycin and clindamycin. She also had stools checked for C. difficile, although I cannot find any reports of diarrhea or the results from those. She did have urine cultures that were positive for 95,000 colonies of Enterococcus species which cultured out as resistant to most antibiotics except for vancomycin. She has been treated for 10 days with vancomycin. She was noted in the last few days to have decreased eating and decreased activities. She is noted to be dehydrated looking. She comes into the ER. Here she was found to have pneumonia on chest x-ray and also to be in acute renal failure and also to have high blood sugars with no history of diabetes.   PAST MEDICAL HISTORY:  1. Myelodysplastic syndrome.  2. Anemia requiring frequent blood transfusions.  3. Estrogen receptor positive breast cancer, currently on tamoxifen.  4. Osteoporosis.  5. History of GI bleeding.  6. Very hard of hearing.   PAST SURGICAL HISTORY:  1. Right mastectomy.  2. Appendectomy.  3. Removal of ovarian cyst.   ALLERGIES: Peanuts.   CURRENT MEDICATIONS:  1. Trazodone 50 mg daily.  2. Tamoxifen 20 mg daily.  3. Protonix 40 mg daily.  4. Nicotine patch daily.  5. Aricept 10 mg daily.  6. Norco 5/325 mg one q.4 hours p.r.n.   SOCIAL HISTORY: She currently lives in a nursing home. She smokes about half to a pack of cigarettes a day.   FAMILY HISTORY: Her brother died from bone cancer.   REVIEW OF SYSTEMS: CONSTITUTIONAL: No fever. EYES: No blurred vision. ENT: Very hard of hearing. CARDIOVASCULAR: No chest pain. PULMONARY: Some shortness of breath. GI: No  nausea, vomiting, or diarrhea. Poor appetite. GU: No dysuria. ENDOCRINE: No heat or cold intolerance. INTEGUMENTARY: No rash. MUSCULOSKELETAL: Occasional joint pain. NEUROLOGIC: No numbness or weakness.   PHYSICAL EXAMINATION:   VITAL SIGNS: Temperature 98.9, pulse 87, respirations 18, blood pressure 110/51, oxygen sat 95% on 2 liters. Vital signs from the nursing home blood pressure 125/49, pulse 108, respirations 16, oxygen sat 91% on room air.   GENERAL: This is a poorly nourished black female in no acute distress.   HEENT: The pupils are equal, round, and reactive. Sclerae is nonicteric. Oral mucosa is dry. Oropharynx is clear. Nasopharynx is clear. She is very hard of hearing.   NECK: Supple. No JVD, lymphadenopathy, or thyromegaly.   CARDIOVASCULAR: Regular rate and rhythm. There is a 1/6 systolic murmur.   LUNGS: Clear to auscultation on the right side. There are some mild rales on the left side. No dullness to percussion. She is not using accessory muscles.   ABDOMEN: Soft, nontender, nondistended. Bowel sounds are positive. No hepatosplenomegaly. No masses.   EXTREMITIES: There is no edema.   NEUROLOGIC: Cranial nerves II through XII appear to be intact. She is alert and oriented x4 but again extremely hard of hearing.   LABORATORY, DIAGNOSTIC, AND RADIOLOGICAL DATA: Chest x-ray shows left lower lobe infiltrate.   Lactic acid 3.9. BUN 56, creatinine 3.46, sodium 139, potassium 5, sodium 108, bicarb 14, white blood cells 40, hemoglobin 9.5, platelets 262. Urinalysis shows no sign of infection. Blood glucose 287.  ASSESSMENT AND PLAN:  1. Sepsis. The patient appears to be septic with high white count, tachycardia from the original set of vitals, and pneumonia on chest x-ray. I suspect that the sepsis is from some type of bacterial pneumonia. Will treat underlying cause and give supportive care.  2. Pneumonia, suspect this could be bacterial. She is in the healthcare setting in a  nursing home. I will go ahead and treat her with some Zosyn and Levaquin. Get cultures, both sputum and blood.  3. Acute renal failure. Will give her IV fluids and recheck her renal function. She has recently been on vancomycin which can be nephrotoxic. If she does not respond to fluids, will get Nephrology consult with that history in mind.  4. Metabolic acidosis. This is probably from the sepsis and the renal failure. This should correct out with treatment.  5. Hyperglycemia. No history of diabetes. Suspect this is stress from the infection. For now will treat this with insulin. Get a hemoglobin A1c. Start long-term medications if necessary.  6. Leukocytosis. She actually has a history of low white blood cell count, neutropenia in the past. Unusual to see a white count this high. She has not been on any bone marrow stimulating medications [facility] or any recent steroids. She did have some stools checked for Clostridium difficile at the nursing home which I cannot find the results from. She does not have any diarrhea. Will go ahead and recheck those stools as this can cause extremely high white counts.  7. Recent urinary tract infection. She has been treated with clindamycin and vancomycin at that point. Her urine looks clean here on urinalysis. Will go ahead and repeat a urine culture.   TIME SPENT ON ADMISSION: 65 minutes.   ____________________________ Gracelyn NurseJohn D. Kyrie Fludd, MD jdj:drc D: 10/05/2012 22:54:16 ET T: 10/06/2012 08:23:45 ET JOB#: [id number]  cc: Gracelyn NurseJohn D. Elianne Gubser, MD, <Dictator>, Serita ShellerErnest B. Maryellen PileEason, MD, Gerome SamJanak K. Doylene Canninghoksi, MD Gracelyn NurseJOHN D Keano Guggenheim MD ELECTRONICALLY SIGNED 10/06/2012 16:18

## 2015-04-12 NOTE — Consult Note (Signed)
ONCOLOGY Consult note -   Referring MD: MD: Dr.Raegyn Renda of Consult: 10/11/12  Reason for Consult: abnormal peripheral smear, h/o myelodysplatic syndrome  81 year old female followed by Dr. Doylene Canninghoksi for known h/o anemia, breast cancer, myelodysplastic disorder. She was recently treated for pneumonia with vancomycin and clindamycin. Now admitted with poor oral intake, weakness and decreased activity. She was found to have hyperglycemia, pneumonia on chest x-ray and also to be in acute renal failure. Currently patient is resting, non-communicative, very weak. CBC shows leucocytosis has significantly improved, anemia is worse with Hb down to 6.5. Stool OB is positive.  MEDICAL HISTORY:  1. Myelodysplastic syndrome.  2. Anemia requiring frequent blood transfusions.  Estrogen receptor positive breast cancer, currently on tamoxifen.  Osteoporosis.  History of GI bleeding.  Very hard of hearing.  SURGICAL HISTORY:  1. Right mastectomy.  2. Appendectomy.  Removal of ovarian cyst.   ALLERGIES: Peanuts.  MEDICATION:  1. Trazodone 50 mg daily.  2. Tamoxifen 20 mg daily.  Protonix 40 mg daily.  Nicotine patch daily.  Aricept 10 mg daily.  Norco 5/325 mg one q.4 hours p.r.n.  HISTORY: lives in a nursing home. Smoker.  HISTORY: noncontributory.  OF SYSTEMS: currently unobtainable.  EXAM:  GENERAL: patient is very weak looking, lethargic, currently non-communicative. Otherwise NAD. Pallor ++.  VITAL SIGNS:  97.9, 84, 16, 94/59, 90% on 4LSclerae is nonicteric. Oral mucosa is dry. S1S2, regular b/l decreased BS at bases, no rhonchi Soft, nontender, nondistended. Bowel sounds are positive. no edema.unable to perform, lethargic and non-communicative  DIAGNOSTIC, AND RADIOLOGICAL DATA: Hb 6.5, WBC 8300 (was [id number]), plts 111K, Cr 2.64, BUN 63, Ca 7.7. Stool OB positive. C. diff positive.x-ray shows left lower lobe infiltrate.   81 year old female followed by Dr. Doylene Canninghoksi for known h/o anemia, breast cancer,  myelodysplastic disorder. Now admitted with poor oral intake, weakness and decreased activity, has C. diff colitis, renal insufficiency, altered mental status, anemia (likely multifactorial given known h/o MDS, stool hemoccult positive, ongoing acute illness. Peripheral smear reports significant abnormalities including NRBC, target cells, schistocytes, anisocytes which is likely from known h/o MDS alongwith renal failure. Will also check PT, PTT, fibrinogen, FDP to look for evidence of coagulopathy/DIC. Leukocytosis has shown improvement. Has mild thrombocytopenia which is likely from known MDS alongwith ongoing recent infection/illness +/- DIC. Continue supportive treatment, transfusions as indicated, and monitor CBC. Given her poor overall condition alongwith multiple medical issues, overall prognosis seems very poor. Palliative care is already following. Patient is DNR. Will continue to follow.   Electronic Signatures: Catherine Mcintosh, Catherine Mcintosh (MD)  (Signed on 19-Oct-13 23:23)  Authored  Last Updated: 19-Oct-13 23:23 by Catherine Mcintosh, Adonia Porada Mcintosh (MD)

## 2015-04-12 NOTE — Consult Note (Signed)
PATIENT NAME:  Catherine Mcintosh, Catherine Mcintosh MR#:  [id number] DATE OF BIRTH:  1930/02/10  DATE OF CONSULTATION:  07/14/2012  REFERRING PHYSICIAN:   CONSULTING PHYSICIAN:  Ezzard Standing. Bluford Kaufmann, MD  REASON FOR REFERRAL: Anemia, melena.   HISTORY OF PRESENT ILLNESS: The patient is an 81 year old black female with a known history of breast cancer who has had mastectomy who has been receiving chemotherapy. The patient was recently in the hospital for anemia, had dropped to 5.5. After 1 unit of blood transfusion it has gone up to 7.4. The patient feels weak and tired. The patient complains of some dark stools for the past one month. She has no nausea, vomiting, heartburn, indigestion, or any specific abdominal pain. She does have some left flank discomfort. Her appetite is poor. She has also been taking some iron supplements as well.   REVIEW OF SYSTEMS: No fevers or chills. There is no coughing or shortness of breath. There is no chest pain or palpitations. No hearing or visual changes. GI symptoms have been described already.   PAST MEDICAL HISTORY: 1. Breast cancer. 2. Hypertension.   PAST SURGICAL HISTORY:  1. Oophorectomy.  2. Appendectomy.   FAMILY HISTORY: One brother died from bone cancer. Another sister died from breast cancer. Two brothers died from lung cancer.    SOCIAL HISTORY: She does not smoke but quit in May after smoking half a pack per day for 30 years. She does not use any alcohol.   ALLERGIES: She has no drug allergies.   She had a colonoscopy by Dr. Marva Panda in January 2011 for history of polyps. There were no polyps seen but she did have diverticular disease affecting the whole colon.   HOME MEDICATIONS:  1. Torsemide. 2. Hydrochlorothiazide. 3. Aleve. She has taken some Aleve p.r.n. because of this left flank pain. She could not tell me how often she was taking it for and for how long.   PHYSICAL EXAMINATION:   GENERAL: The patient is in no acute distress.   VITAL SIGNS: She was afebrile.  Vital signs are stable.   HEENT: Normocephalic, atraumatic head. Pupils are equally reactive. Throat was clear.   NECK: Supple.   CARDIAC: Regular rhythm and rate without any murmurs.   LUNGS: Clear bilaterally.   ABDOMEN: Normoactive bowel sounds. Soft. There is no hepatomegaly. There are no palpable masses. There is active bowel sounds. Soft and nontender.   EXTREMITIES: No clubbing, cyanosis, or edema.   NEUROLOGICAL: Nonfocal.   SKIN: Essentially negative.   LABORATORY STUDIES: Sodium 142, potassium 3.7, chloride 106, CO2 24, BUN 35, creatinine 1.17. Ferritin level 746. Liver enzymes are normal except for albumin at 2.9. White count 2.8. Hemoglobin 7.4. It was 5.5 on July 8th.   ASSESSMENT AND PLAN: This is a patient with significant anemia with melena. She has been taking iron which can cause melena. However, she has also been taking Aleve for pain which can cause bleeding ulcers. Will try to schedule an endoscopy for her. Since I am not available, I will ask one of my partners to do an endoscopy tomorrow afternoon. The patient did not want to wait until Wednesday to do the procedure. The patient will be kept n.p.o. after midnight and have endoscopy sometime tomorrow afternoon. We could not do the endoscopy today because the patient already had cereal this morning.   Thank you for the referral.   ____________________________ Ezzard Standing. Bluford Kaufmann, MD pyo:drc D: 07/14/2012 15:41:23 ET T: 07/15/2012 09:40:05 ET JOB#: [id number]  cc: Ezzard Standing. Latonia Conrow,  MD, <Dictator> Ezzard StandingPAUL Y Abiha Lukehart MD ELECTRONICALLY SIGNED 07/17/2012 10:55

## 2015-04-12 NOTE — Discharge Summary (Signed)
PATIENT NAME:  Catherine Mcintosh, Catherine Mcintosh MR#:  [id number] DATE OF BIRTH:  11-03-1930  DATE OF ADMISSION:  10/05/2012 DATE OF DISCHARGE:  10/14/2012  DISCHARGE DIAGNOSES:  1. Sepsis with possible low-grade disseminated intravascular coagulation, started on slow dose steroid taper.  2. Clostridium difficile colitis, on Flagyl for 10 days, finish course of 10/26.   3. Acute delirium with underlying dementia, likely ongoing medical issue. 4. Chronic anemia with history of myelodysplastic syndrome and abnormal peripheral smear worrisome for possible low-grade disseminated intravascular coagulation status post three packed red blood cell transfusion. Pneumonia ruled out.  5. Acute renal failure secondary to dehydration, creatinine improving.  6. Metabolic acidosis, resolved with bicarbonate drip. 7. Hyperglycemia likely due to stress reaction and/or on steroids. 8. Severe malnutrition, likely due to poor oral intake.   SECONDARY DIAGNOSES:  1. Myelodysplastic syndrome. 2. Anemia requiring frequent blood transfusions.  3. History of breast cancer.  4. Osteoporosis. 5. History of gastrointestinal bleeding.   CONSULTATIONS:  1. Infectious disease, Dr. Leavy CellaBlocker.  2. Palliative care, Dr. Harvie JuniorPhifer.  3. Nephrology, Dr. Cherylann RatelLateef.  4. Oncology, Dr. Doylene Canninghoksi.   LABORATORY, DIAGNOSTIC AND RADIOLOGICAL DATA: Chest x-ray on 10/05/2012 showed left lower lobe pneumonia. Small left pleural effusion. Bilateral renal ultrasound on 10/07/2012 showed ascites. No stones or obstructive changes.   CT scan of the abdomen and pelvis without contrast on 10/07/2012 showed anasarca manifested by pleural effusion, large amount of ascites and diffusely increased density in the subcutaneous soft tissues. No evidence of bowel obstruction or ileus. No acute colitis. No evidence of urinary tract obstruction. No definite acute abnormality.   Urinalysis on admission showed trace bacteria, 9 WBCs, otherwise negative. Also had urinalysis on  10/05/2012 which was negative.   Stool for C. difficile was positive.   Blood cultures x2 were negative.   Urine culture was negative on admission.   Hemoccult stool was positive on 10/15. Serum vitamin B12 level was elevated with a value of 1999.   Haptoglobin was within normal limits with a value of 141. Hemoccult stool was positive on 10/13/2012 also when it was rechecked.    HISTORY AND SHORT HOSPITAL COURSE: Patient is an 81 year old female with above-mentioned medical problems was admitted for sepsis thought to be secondary to pneumonia on admission although subsequently this was ruled out as her stool for C. difficile was positive. Infectious disease consult was obtained with Dr. Leavy CellaBlocker who recommended to switch her antibiotic to vancomycin and subsequently this was changed to Flagyl only for treatment of C. difficile. Please see Dr. Stasia CavalierJohnston's dictated history and physical for further details. She was evaluated by nephrology, Dr. Cherylann RatelLateef, considering acute renal failure. Her creatinine was up to 3.9. This was thought to be prerenal in nature and was hydrated slowly with IV fluids and was improving. She was also started on bicarbonate drip due to metabolic acidosis and she had somewhat improvement but overall her clinical status was getting worse. She was evaluated by palliative care, Dr. Harvie JuniorPhifer, considering poor p.o. intake, hypoglycemia and failure to thrive. After discussion with family, patient was made eligible for hospice and DO NOT RESUSCITATE. Patient was still unable to get much oral intake and was getting hypoglycemic throughout the hospital stay and required several blood transfusions (three) due to severe anemia with hemoglobin as low as 6.3. This was thought to be due to possible mild hemolysis due to mild DIC for which oncology consultation was obtained with Dr. Doylene Canninghoksi who recommended starting steroid with a slow taper and was done. Patient is  doing fair to poor and is being  discharged to [facility] with hospice.   PHYSICAL EXAMINATION: VITAL SIGNS: On the date of discharge her vital signs are as follows: Temperature 99.2, heart rate 92 per minute, respirations 18 per minute, blood pressure 110/61 mmHg. She is saturating 91% on 2 liters oxygen via nasal cannula. Pertinent physical examination on the date of discharge: CARDIOVASCULAR: S1, S2 normal. No murmurs, rales, or gallop. LUNGS: Clear to auscultation bilaterally. No wheezing, rales, rhonchi, crepitation. ABDOMEN: Soft, benign. EXTREMITIES: She does have 1+ edema in both extremities. Has a Foley catheter in place. NEUROLOGIC: Unable to assess as she would not cooperate with the exam and she remained lethargic and at times whenever she wakes up she starts getting agitated. All other physical examination remained at the baseline.   DISCHARGE MEDICATIONS:  1. Acetaminophen/hydrocodone 500/5, 1 tablet p.o. every four hours as needed.  2. Aricept 10 mg p.o. at bedtime.  3. Nicotine 7 mg transdermal patch daily.  4. Protonix 40 mg p.o. b.i.d.  5. Tamoxifen 20 mg p.o. daily.  6. Trazodone 50 mg p.o. at bedtime.  7. Metronidazole 250 mg p.o. every eight hours up until 10/26 as per infectious disease doctor.  8. Prednisone 60 mg p.o. daily, 10 mg daily taper until reaches 20 mg daily dose which will be continued as a maintenance dose after that.   DISCHARGE DIET: Low sodium. Puree with nectar thick liquids. Straws okay if no coughing. Medications in puree, crushed as able. Strict aspiration precautions. Feeding assistance at all meals. Aspiration precautions to be followed all the time. Sit fully upright for p.o. intake. Small single bites and sips. Feed slowly. Check for oral clearing between bites and alternate food with sip of liquid as necessary to aid clearing. Patient can orally hold food at times. Reduce distraction. Medications in puree.   DISCHARGE ACTIVITY: As tolerated.   DISCHARGE INSTRUCTIONS AND  FOLLOW UP: Patient was instructed to follow up with her primary care physician, Gabriel Cirri, NP, in 1 to 2 weeks. She will need follow up with Dr. Johney Maine in 2 to 4 weeks from oncology.   DISPOSITION:  She is going to [facility] under hospice care.       TOTAL TIME DISCHARGING THIS PATIENT: 55 minutes.   ____________________________ Ellamae Sia. Sherryll Burger, MD vss:cms D: 10/14/2012 15:51:41 ET T: 10/14/2012 16:29:25 ET JOB#: [id number]  cc: Brealyn Baril S. Sherryll Burger, MD, <Dictator> Phillips Odor. Jamesetta Orleans, NP Gerome Sam. Doylene Canning, MD Ned Grace, MD Mun[place] Lizabeth Leyden, MD Rosalyn Gess. Blocker, MD Patricia Pesa MD ELECTRONICALLY SIGNED 10/15/2012 14:55

## 2015-04-12 NOTE — Consult Note (Signed)
ONCOLOGY followup note -  HPI: still weak, awake. Confused. ROS: no fevers. No obvious bleeding Sxs Exam: weak, otherwise NAD.             vitals - 98.2, 88, 18, 100/57             lungs - b/l diminished BS             abd - soft, NT Labs: Hb 6.7, WBC 9000, 68% neutrophils, platelets clumped, BUN 59, Cr 2.3, LDH 211. PT, PTT, fibrinogen unremarkable. FDP 10-40.  IMPRESSION/RECOMMENDATIONS:  81 year old female followed by Dr. Doylene Canninghoksi for known h/o anemia, breast cancer, myelodysplastic disorder. Now admitted with poor oral intake, weakness and decreased activity, has C. diff colitis, renal insufficiency, altered mental status, anemia (likely multifactorial given known h/o MDS, stool hemoccult positive, ongoing acute illness. Peripheral smear reports significant abnormalities including NRBC, target cells, schistocytes, anisocytes which is likely from known h/o MDS alongwith renal failure. PT, PTT, fibrinogen unremarkable, FDP slightly abnormal. Leukocytosis has shown improvement. Has mild thrombocytopenia which is likely from known MDS alongwith ongoing recent infection/illness +/- DIC. Continue supportive treatment, transfusions as indicated, and monitor CBC. Given her poor overall condition alongwith multiple medical issues, overall prognosis seems very poor. Palliative care is already following. Patient is DNR. Oncology will continue to follow   Electronic Signatures: Izola PricePandit, Gerod Caligiuri Raj (MD)  (Signed on 20-Oct-13 22:43)  Authored  Last Updated: 20-Oct-13 22:43 by Izola PricePandit, Emree Locicero Raj (MD)

## 2015-04-17 NOTE — Discharge Summary (Signed)
PATIENT NAME:  Catherine Mcintosh, Curley MR#:  [id number] DATE OF BIRTH:  Aug 31, 1930  DATE OF ADMISSION:  03/03/2012 DATE OF DISCHARGE:  03/06/2012  FINAL DIAGNOSES:  1. Anemia secondary to multiple underlying factors including myelodysplastic syndrome and renal failure.  2. Acute renal failure due to dehydration and diuretic therapy.  3. Generalized rash, etiology is not clear.  4. History of carcinoma of the breast.  5. Failure to thrive.   DISCHARGE MEDICATIONS: Hold all medications that the patient was taking at home and start the following.  1. Benadryl 25 mg p.o. twice a day p.r.n.  2. Allegra 180 mg p.o. daily.  3. Cipro 250 mg p.o. every 12 hourly. 4. Tylenol 650 mg p.r.n. every 8 hourly for pain.   TREATMENTS: None.   HOME OXYGEN: Not needed.   ACTIVITY: As tolerated.   DIET: Regular.   DISCHARGE FOLLOWUP: Reevaluation in 7 to 10 days.   CONDITION AT DISCHARGE: Overall general condition is fair.   HISTORY OF PRESENT ILLNESS: [name] is a 81 year old lady who was brought to my office with progressive deterioration of general condition, poor appetite, and generalized rash. The patient has been seen by a dermatologist. Biopsy was showing possibility of allergic reaction, hypersensitive dermatitis. The exact etiology was not clear. The patient was started on steroids, but started having increasing swelling, becoming more confused and disoriented. The patient had progressive increase in creatinine and BUN and also was anemic, so the patient was admitted the hospital for further evaluation and treatment consideration.   AVAILABLE LAB DATA AND X-RAY DATA: On the day of admission, hemoglobin was 9.7 and white count was 5.0. On 03/05/2012 hemoglobin was 8.4. BUN was 36.   On the day of discharge, BUN was 26 and creatinine 1.16, and hemoglobin was 10.0.   Heavy metal screen, which was ordered, revealed arsenic total 17 mcg. No other abnormality was detected.   HOSPITAL COURSE: During the  hospital stay, the patient was taken off all medications that the patient was taking at home. She was started on IV fluid and received blood transfusion. Her rash dramatically improved. The possibility of an allergic reaction to one of the medications that she was taking at home could not be ruled out. Other factors included home environmental condition. After a prolonged discussion with family, where we recommended the patient to an assisted living facility, they refused; the patient refused. The patient was sent back home for further evaluation and treatment consideration. Her overall prognosis was guarded.  ____________________________ Gerome SamJanak K. Shed Nixon, MD jkc:slb D: 03/19/2012 12:23:02 ET T: 03/19/2012 13:48:04 ET JOB#: [id number]  cc: Valarie ConesJanak K. Doylene Canninghoksi, MD, <Dictator> Laddie AquasJANAK K Alli Jasmer MD ELECTRONICALLY SIGNED 03/20/2012 7:42
